# Patient Record
Sex: Female | Born: 1995 | Race: Black or African American | Hispanic: No | Marital: Single | Smoking: Never smoker
Health system: Southern US, Community
[De-identification: ages and names within clinical notes are randomized; demographics above are authoritative.]

## PROBLEM LIST (undated history)

## (undated) ENCOUNTER — Inpatient Hospital Stay (HOSPITAL_COMMUNITY): Payer: Self-pay

## (undated) DIAGNOSIS — B009 Herpesviral infection, unspecified: Secondary | ICD-10-CM

## (undated) DIAGNOSIS — Z3493 Encounter for supervision of normal pregnancy, unspecified, third trimester: Secondary | ICD-10-CM

## (undated) DIAGNOSIS — Z789 Other specified health status: Secondary | ICD-10-CM

## (undated) HISTORY — PX: TONSILLECTOMY: SUR1361

---
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
## 2016-09-29 ENCOUNTER — Ambulatory Visit (HOSPITAL_COMMUNITY)
Admission: EM | Admit: 2016-09-29 | Discharge: 2016-09-29 | Disposition: A | Discharge status: Home / Self Care | Payer: BLUE CROSS/BLUE SHIELD | Attending: Family Medicine | Admitting: Family Medicine

## 2016-09-29 ENCOUNTER — Encounter (HOSPITAL_COMMUNITY): Payer: Self-pay | Admitting: Emergency Medicine

## 2016-09-29 DIAGNOSIS — R21 Rash and other nonspecific skin eruption: Secondary | ICD-10-CM

## 2016-09-29 DIAGNOSIS — T7840XA Allergy, unspecified, initial encounter: Secondary | ICD-10-CM

## 2016-09-29 DIAGNOSIS — L509 Urticaria, unspecified: Secondary | ICD-10-CM

## 2016-09-29 MED ORDER — PREDNISONE 20 MG PO TABS: 20.0000 mg | ORAL_TABLET | Freq: Every day | ORAL | 0 refills | Status: AC

## 2016-09-29 NOTE — ED Notes (Signed)
Patient denies pain and is resting comfortably.  

## 2016-09-29 NOTE — Discharge Instructions (Signed)
You got allergic to something, most likely from the new lotion that you put on yesterday. Take the prednisone for the rash. May continue the benadryl as well for itchiness. Hope you feel better.

## 2016-09-29 NOTE — ED Provider Notes (Signed)
CSN: [id number]     Arrival date & time 09/29/16  1915 History   First MD Initiated Contact with Patient 09/29/16 2001     Chief Complaint  Patient presents with  . Rash   (Consider location/radiation/quality/duration/timing/severity/associated sxs/prior Treatment) [name] is a well-appearing 20 y.o female, with no medical history, presents today for rash all over her body (face, breast, arm, leg and back). She woke up this morning from sleep at 4 am due to skin itchiness and noticed the rash. Rash is very itchy. She have been taking benadryl at home to help with itchiness. She did use a new lotion yesterday morning; lotion was from [employer] and was high fragrance content. She otherwise denies new med, new food, plant exposure, or new laundry detergent. She denies SOB, swollen lips or coughing.    The history is provided by the patient.    History reviewed. No pertinent past medical history. Past Surgical History:  Procedure Laterality Date  . TONSILLECTOMY     No family history on file. Social History  Substance Use Topics  . Smoking status: Never Smoker  . Smokeless tobacco: Never Used  . Alcohol use No   OB History    No data available     Review of Systems  All other systems reviewed and are negative.   Allergies  Review of patient's allergies indicates no known allergies.  Home Medications   Prior to Admission medications   Medication Sig Start Date End Date Taking? Authorizing Provider  diphenhydrAMINE (BENADRYL) 50 MG tablet Take 50 mg by mouth at bedtime as needed for itching.   Yes Historical Provider, MD  predniSONE (DELTASONE) 20 MG tablet Take 1 tablet (20 mg total) by mouth daily with breakfast. Take 3 tablets on day 1-3, two tablets on day 4-6, and 1 tablet on day 7-9 09/29/16 10/08/16  Lucia EstelleFeng Dhrithi Riche, NP   Meds Ordered and Administered this Visit  Medications - No data to display  BP 131/79 (BP Location: Left Arm)   Pulse 78   Temp 99.6 F (37.6  C) (Oral)   Resp 20   LMP 09/09/2016   SpO2 100%  No data found.   Physical Exam  Constitutional: She appears well-developed and well-nourished.  Cardiovascular: Normal rate, regular rhythm and normal heart sounds.   Pulmonary/Chest: Effort normal and breath sounds normal. No respiratory distress. She has no wheezes.  Skin:  Has urticaria rash scatter throughout body on face, back, arms, and legs  Nursing note and vitals reviewed.   Urgent Care Course   Clinical Course    Procedures (including critical care time)  Labs Review Labs Reviewed - No data to display  Imaging Review No results found.   MDM   1. Allergic reaction, initial encounter   2. Urticaria    1) Start taking prednisone 2) Continue with benadryl for itchiness 3) stop using the lotion 4) Return if you do not improve.     Lucia EstelleFeng Kacee Sukhu, NP 09/29/16 2014

## 2016-09-29 NOTE — ED Triage Notes (Signed)
Patient has a splotchy rash to face, arms, breasts, thighs.  Itchy rash, onset yesterday evening.  Patient used a new lotion yesterday morning.  Patient not sure if this caused the rash or not.

## 2016-11-14 ENCOUNTER — Emergency Department (HOSPITAL_COMMUNITY)
Admission: EM | Admit: 2016-11-14 | Discharge: 2016-11-14 | Disposition: A | Discharge status: Home / Self Care | Payer: BLUE CROSS/BLUE SHIELD | Attending: Emergency Medicine | Admitting: Emergency Medicine

## 2016-11-14 ENCOUNTER — Encounter (HOSPITAL_COMMUNITY): Payer: Self-pay | Admitting: Emergency Medicine

## 2016-11-14 DIAGNOSIS — H9202 Otalgia, left ear: Secondary | ICD-10-CM | POA: Diagnosis present

## 2016-11-14 DIAGNOSIS — H66005 Acute suppurative otitis media without spontaneous rupture of ear drum, recurrent, left ear: Secondary | ICD-10-CM | POA: Diagnosis not present

## 2016-11-14 MED ORDER — AMOXICILLIN 500 MG PO CAPS: 500.0000 mg | ORAL_CAPSULE | Freq: Two times a day (BID) | ORAL | 0 refills | Status: DC

## 2016-11-14 NOTE — ED Triage Notes (Signed)
Pt reports sore throat x 1 week. R/ear pain since this am. Treated with Tylenol at 1100 with some relief. Denies shortness of breath. No cough

## 2016-11-14 NOTE — Discharge Instructions (Signed)
Please read and follow all provided instructions.  Your diagnoses today include:  1. Recurrent acute suppurative otitis media without spontaneous rupture of left tympanic membrane    Tests performed today include:  Vital signs. See below for your results today.   Medications prescribed:   Amoxicillin - antibiotic  You have been prescribed an antibiotic medicine: take the entire course of medicine even if you are feeling better. Stopping early can cause the antibiotic not to work.  Take any prescribed medications only as directed.  Home care instructions:  Follow any educational materials contained in this packet.  BE VERY CAREFUL not to take multiple medicines containing Tylenol (also called acetaminophen). Doing so can lead to an overdose which can damage your liver and cause liver failure and possibly death.   Follow-up instructions: Please follow-up with your primary care provider in the next 3 days for further evaluation of your symptoms if not improved.   Return instructions:   Please return to the Emergency Department if you experience worsening symptoms.   Please return if you have any other emergent concerns.  Additional Information:  Your vital signs today were: BP 131/84 (BP Location: Left Arm)    Pulse 75    Temp 98.5 F (36.9 C) (Oral)    Resp 18    Wt 117.9 kg    LMP 10/30/2016 (Exact Date)    SpO2 97%  If your blood pressure (BP) was elevated above 135/85 this visit, please have this repeated by your doctor within one month. --------------

## 2016-11-14 NOTE — ED Provider Notes (Signed)
[facility]-EMERGENCY DEPT Provider Note   CSN: [id number] Arrival date & time: 11/14/16  1114   By signing my name below, I, Nelwyn SalisburyJoshua Fowler, attest that this documentation has been prepared under the direction and in the presence of non-physician practitioner, Rhea BleacherJosh Tashanti Dalporto PA-C. Electronically Signed: Nelwyn SalisburyJoshua Fowler, Scribe. 11/14/2016. 11:48 AM.   History   Chief Complaint Chief Complaint  Patient presents with  . Sore Throat  . Otalgia   The history is provided by the patient. No language interpreter was used.    HPI Comments:  Colleen Zamora is an otherwise healthy 20 y.o. female who presents to the Emergency Department complaining of sudden-onset constant unchanged left ear pain beginning this morning. Pt states that she has tried Tylenol about an hour ago with minor relief. She reports associated cough and resolved sore throat beginning 7 days ago. Pt denies any rhinorrhea, congestion, fever, vomiting, diarrhea, or headache.  History reviewed. No pertinent past medical history.  There are no active problems to display for this patient.   Past Surgical History:  Procedure Laterality Date  . TONSILLECTOMY      OB History    No data available       Home Medications    Prior to Admission medications   Medication Sig Start Date End Date Taking? Authorizing Provider  diphenhydrAMINE (BENADRYL) 50 MG tablet Take 50 mg by mouth at bedtime as needed for itching.    Historical Provider, MD    Family History Family History  Problem Relation Age of Onset  . Diabetes Mother     Social History Social History  Substance Use Topics  . Smoking status: Never Smoker  . Smokeless tobacco: Never Used  . Alcohol use Yes     Allergies   Ceclor [cefaclor]   Review of Systems Review of Systems  Constitutional: Negative for chills, fatigue and fever.  HENT: Positive for ear pain. Negative for congestion, rhinorrhea, sinus pressure and sore throat (Resolved).   Eyes: Negative for  redness.  Respiratory: Positive for cough. Negative for wheezing.   Gastrointestinal: Negative for abdominal pain, diarrhea, nausea and vomiting.  Genitourinary: Negative for dysuria.  Musculoskeletal: Negative for myalgias and neck stiffness.  Skin: Negative for rash.  Neurological: Negative for headaches.  Hematological: Negative for adenopathy.     Physical Exam Updated Vital Signs BP 131/84 (BP Location: Left Arm)   Pulse 75   Temp 98.5 F (36.9 C) (Oral)   Resp 18   Wt 260 lb (117.9 kg)   LMP 10/30/2016 (Exact Date)   SpO2 97%   Physical Exam  Constitutional: She appears well-developed and well-nourished. No distress.  HENT:  Head: Normocephalic and atraumatic.  Right Ear: External ear and ear canal normal. Tympanic membrane is not erythematous.  Left Ear: External ear and ear canal normal. Tympanic membrane is erythematous.  Eyes: Conjunctivae are normal.  Cardiovascular: Normal rate.   No murmur heard. Pulmonary/Chest: Effort normal. No respiratory distress. She has no wheezes. She has no rales.  Abdominal: She exhibits no distension.  Lymphadenopathy:    She has no cervical adenopathy.  Neurological: She is alert. Cranial nerve deficit:   Skin: Skin is warm and dry.  Psychiatric: She has a normal mood and affect.  Nursing note and vitals reviewed.    ED Treatments / Results  DIAGNOSTIC STUDIES:  Oxygen Saturation is 97% on Ra, normal by my interpretation.    COORDINATION OF CARE:  11:57 AM Discussed treatment plan with pt at bedside which includes abx and  pt agreed to plan.  Procedures Procedures (including critical care time)   Initial Impression / Assessment and Plan / ED Course  I have reviewed the triage vital signs and the nursing notes.  Pertinent labs & imaging results that were available during my care of the patient were reviewed by me and considered in my medical decision making (see chart for details).  Clinical Course    Patient seen  and examined. Will treat for otitis media. NSAIDs at home for pain.   Vital signs reviewed and are as follows: BP 131/84 (BP Location: Left Arm)   Pulse 74   Temp 98.5 F (36.9 C) (Oral)   Resp 18   Wt 117.9 kg   LMP 10/30/2016 (Exact Date)   SpO2 99%   Patient counseled on supportive care for infection and s/s to return including worsening symptoms, persistent fever, persistent vomiting, or if they have any other concerns. Urged to see PCP if symptoms persist for more than 3 days. Patient verbalizes understanding and agrees with plan.     Final Clinical Impressions(s) / ED Diagnoses   Final diagnoses:  Recurrent acute suppurative otitis media without spontaneous rupture of left tympanic membrane   Pt with left otitis media, uncomplicated.   New Prescriptions New Prescriptions   AMOXICILLIN (AMOXIL) 500 MG CAPSULE    Take 1 capsule (500 mg total) by mouth 2 (two) times daily.  I personally performed the services described in this documentation, which was scribed in my presence. The recorded information has been reviewed and is accurate.      Renne CriglerJoshua Caiden Monsivais, PA-C 11/14/16 1214    Melene Planan Floyd, DO 11/14/16 2026

## 2017-04-12 ENCOUNTER — Emergency Department (HOSPITAL_COMMUNITY)
Admission: EM | Admit: 2017-04-12 | Discharge: 2017-04-12 | Disposition: A | Discharge status: Home / Self Care | Payer: BLUE CROSS/BLUE SHIELD | Attending: Emergency Medicine | Admitting: Emergency Medicine

## 2017-04-12 ENCOUNTER — Encounter (HOSPITAL_COMMUNITY): Payer: Self-pay | Admitting: Emergency Medicine

## 2017-04-12 DIAGNOSIS — N939 Abnormal uterine and vaginal bleeding, unspecified: Secondary | ICD-10-CM | POA: Insufficient documentation

## 2017-04-12 DIAGNOSIS — Z79899 Other long term (current) drug therapy: Secondary | ICD-10-CM | POA: Insufficient documentation

## 2017-04-12 LAB — BASIC METABOLIC PANEL
ANION GAP: 6 (ref 5–15)
BUN: 14 mg/dL (ref 6–20)
CALCIUM: 9.1 mg/dL (ref 8.9–10.3)
CO2: 27 mmol/L (ref 22–32)
CREATININE: 0.77 mg/dL (ref 0.44–1.00)
Chloride: 106 mmol/L (ref 101–111)
Glucose, Bld: 87 mg/dL (ref 65–99)
Potassium: 3.8 mmol/L (ref 3.5–5.1)
Sodium: 139 mmol/L (ref 135–145)

## 2017-04-12 LAB — CBC WITH DIFFERENTIAL/PLATELET
BASOS ABS: 0 10*3/uL (ref 0.0–0.1)
BASOS PCT: 0 %
EOS ABS: 0.1 10*3/uL (ref 0.0–0.7)
EOS PCT: 2 %
HCT: 39.1 % (ref 36.0–46.0)
Hemoglobin: 13 g/dL (ref 12.0–15.0)
LYMPHS ABS: 2.6 10*3/uL (ref 0.7–4.0)
LYMPHS PCT: 37 %
MCH: 29.3 pg (ref 26.0–34.0)
MCHC: 33.2 g/dL (ref 30.0–36.0)
MCV: 88.1 fL (ref 78.0–100.0)
MONOS PCT: 7 %
Monocytes Absolute: 0.5 10*3/uL (ref 0.1–1.0)
NEUTROS PCT: 54 %
Neutro Abs: 3.8 10*3/uL (ref 1.7–7.7)
PLATELETS: 343 10*3/uL (ref 150–400)
RBC: 4.44 MIL/uL (ref 3.87–5.11)
RDW: 12.6 % (ref 11.5–15.5)
WBC: 7.1 10*3/uL (ref 4.0–10.5)

## 2017-04-12 LAB — WET PREP, GENITAL
Clue Cells Wet Prep HPF POC: NONE SEEN
SPERM: NONE SEEN
Trich, Wet Prep: NONE SEEN
YEAST WET PREP: NONE SEEN

## 2017-04-12 LAB — I-STAT BETA HCG BLOOD, ED (MC, WL, AP ONLY): I-stat hCG, quantitative: <5 m[IU]/mL (ref <5)

## 2017-04-12 NOTE — ED Notes (Signed)
Pt reports to having new vaginal bleeding that is not consistent with her typical period. Pt reports going through 1 pad an hour on 04/10/17

## 2017-04-12 NOTE — ED Triage Notes (Signed)
Pt states she had a normal period on the 14th of April and this past Sunday started having vaginal bleeding  Pt states she is having to change her pad every 1-2 hours  Pt states she has passed some small clots but nothing out of the ordinary  Denies pain but states her stomach has been feeling funny for a couple of weeks

## 2017-04-12 NOTE — ED Provider Notes (Signed)
[facility]-EMERGENCY DEPT Provider Note   CSN: [id number] Arrival date & time: 04/12/17  0155     History   Chief Complaint Chief Complaint  Patient presents with  . Vaginal Bleeding    HPI Colleen Zamora is a 21 y.o. female with no major medical hx presents to the Emergency Department complaining of gradual, persistent, Vaginal bleeding onset 2 days ago. Patient reports that she had a normal menstrual cycle on 03/24/2017. She reports that she took a plan B tablet on 03/31/2017 and began bleeding on April 28th 2018.  She denies all associated symptoms. She denies fevers or chills, nausea or vomiting, abdominal pain. She reports she 6 active with 1 female partner. She reports that on the 19th she had a full STD panel which was negative. She denies concern about STD. Nothing makes her symptoms better or worse. She reports having small clots but no large clots. She denies syncope, near syncope, lightheadedness or dizziness.   The history is provided by the patient and medical records. No language interpreter was used.    History reviewed. No pertinent past medical history.  There are no active problems to display for this patient.   Past Surgical History:  Procedure Laterality Date  . TONSILLECTOMY      OB History    No data available       Home Medications    Prior to Admission medications   Medication Sig Start Date End Date Taking? Authorizing Provider  amoxicillin (AMOXIL) 500 MG capsule Take 1 capsule (500 mg total) by mouth 2 (two) times daily. 11/14/16   Renne Crigler, PA-C  diphenhydrAMINE (BENADRYL) 50 MG tablet Take 50 mg by mouth at bedtime as needed for itching.    Historical Provider, MD    Family History Family History  Problem Relation Age of Onset  . Diabetes Mother     Social History Social History  Substance Use Topics  . Smoking status: Never Smoker  . Smokeless tobacco: Never Used  . Alcohol use Yes     Comment: social     Allergies   Ceclor  [cefaclor]   Review of Systems Review of Systems  Constitutional: Negative for chills and fever.  Eyes: Negative for visual disturbance.  Respiratory: Negative for chest tightness.   Cardiovascular: Negative for chest pain.  Gastrointestinal: Negative for abdominal pain, blood in stool, diarrhea, nausea and vomiting.  Endocrine: Negative for polydipsia, polyphagia and polyuria.  Genitourinary: Positive for vaginal bleeding. Negative for dysuria, flank pain, vaginal discharge and vaginal pain.  Musculoskeletal: Negative for back pain.  Skin: Negative for wound.  Neurological: Negative for syncope and light-headedness.  Hematological: Does not bruise/bleed easily.  All other systems reviewed and are negative.    Physical Exam Updated Vital Signs BP 131/84 (BP Location: Right Arm)   Pulse 80   Temp 98.7 F (37.1 C) (Oral)   Resp 18   Ht  (1.727 m)   Wt 125.6 kg   LMP 03/24/2017 (Exact Date)   SpO2 99%   BMI 42.12 kg/m   Physical Exam  Constitutional: She appears well-developed and well-nourished. No distress.  HENT:  Head: Normocephalic and atraumatic.  Eyes: Conjunctivae are normal.  Neck: Normal range of motion.  Cardiovascular: Normal rate, regular rhythm, normal heart sounds and intact distal pulses.   No murmur heard. Pulmonary/Chest: Effort normal and breath sounds normal. No respiratory distress. She has no wheezes.  Abdominal: Soft. Bowel sounds are normal. There is no tenderness. There is no rebound and  no guarding. Hernia confirmed negative in the right inguinal area and confirmed negative in the left inguinal area.  Genitourinary: Uterus normal. No labial fusion. There is no rash, tenderness or lesion on the right labia. There is no rash, tenderness or lesion on the left labia. Uterus is not deviated, not enlarged, not fixed and not tender. Cervix exhibits no motion tenderness, no discharge and no friability. Right adnexum displays no mass, no tenderness and  no fullness. Left adnexum displays no mass, no tenderness and no fullness. There is bleeding ( Small amount of blood in the vaginal vault) in the vagina. No erythema or tenderness in the vagina. No foreign body in the vagina. No signs of injury around the vagina. No vaginal discharge found.  Genitourinary Comments: Cervical os is closed  Musculoskeletal: Normal range of motion. She exhibits no edema.  Lymphadenopathy:       Right: No inguinal adenopathy present.       Left: No inguinal adenopathy present.  Neurological: She is alert.  Skin: Skin is warm and dry. She is not diaphoretic. No erythema.  Psychiatric: She has a normal mood and affect.  Nursing note and vitals reviewed.    ED Treatments / Results  Labs (all labs ordered are listed, but only abnormal results are displayed) Labs Reviewed  WET PREP, GENITAL - Abnormal; Notable for the following:       Result Value   WBC, Wet Prep HPF POC FEW (*)    All other components within normal limits  CBC WITH DIFFERENTIAL/PLATELET  BASIC METABOLIC PANEL  I-STAT BETA HCG BLOOD, ED ([facility], [facility], [facility] ONLY)    Procedures Procedures (including critical care time)  Medications Ordered in ED Medications - No data to display   Initial Impression / Assessment and Plan / ED Course  I have reviewed the triage vital signs and the nursing notes.  Pertinent labs & imaging results that were available during my care of the patient were reviewed by me and considered in my medical decision making (see chart for details).  Clinical Course as of Apr 12 436  Tue Apr 12, 2017  0435 BHcg is < 5 - not crossing over  [HM]    Clinical Course User Index [HM] Dierdre Forth, PA-C    Patient vaginal bleeding. Negative pregnancy test. No anemia. No evidence of vaginal infection. Patient with recent negative STD testing. No large clots in the vaginal vault. Patient will be referred to OB/GYN for further evaluation. Discussed reasons to return to the  emergency department. Patient states understanding and is in agreement with the plan. Normal vital signs.  Final Clinical Impressions(s) / ED Diagnoses   Final diagnoses:  Vaginal bleeding    New Prescriptions New Prescriptions   No medications on file     Dierdre Forth, PA-C 04/12/17 1610    Geoffery Lyons, MD 04/12/17 [phone]

## 2017-04-12 NOTE — ED Notes (Signed)
Pt reports understanding of discharge information. No questions at time of discharge 

## 2017-04-12 NOTE — Discharge Instructions (Signed)
1. Medications: usual home medications 2. Treatment: rest, drink plenty of fluids,  3. Follow Up: Please followup with your OB/GYN for discussion of your diagnoses and further evaluation after today's visit; if you do not have a primary care doctor use the resource guide provided to find one; Please return to the ER for worsening bleeding, development of pain, passing out, SOB or other concerns

## 2018-03-30 ENCOUNTER — Inpatient Hospital Stay (HOSPITAL_COMMUNITY): Payer: BLUE CROSS/BLUE SHIELD

## 2018-03-30 ENCOUNTER — Inpatient Hospital Stay (HOSPITAL_COMMUNITY)
Admission: AD | Admit: 2018-03-30 | Discharge: 2018-03-30 | Disposition: A | Discharge status: Home / Self Care | Payer: BLUE CROSS/BLUE SHIELD | Source: Ambulatory Visit | Attending: Family Medicine | Admitting: Family Medicine

## 2018-03-30 ENCOUNTER — Encounter (HOSPITAL_COMMUNITY): Payer: Self-pay | Admitting: *Deleted

## 2018-03-30 DIAGNOSIS — O26891 Other specified pregnancy related conditions, first trimester: Secondary | ICD-10-CM

## 2018-03-30 DIAGNOSIS — R110 Nausea: Secondary | ICD-10-CM | POA: Diagnosis present

## 2018-03-30 DIAGNOSIS — N8311 Corpus luteum cyst of right ovary: Secondary | ICD-10-CM | POA: Insufficient documentation

## 2018-03-30 DIAGNOSIS — Z3A01 Less than 8 weeks gestation of pregnancy: Secondary | ICD-10-CM | POA: Diagnosis not present

## 2018-03-30 DIAGNOSIS — O3680X0 Pregnancy with inconclusive fetal viability, not applicable or unspecified: Secondary | ICD-10-CM | POA: Diagnosis not present

## 2018-03-30 DIAGNOSIS — R1030 Lower abdominal pain, unspecified: Secondary | ICD-10-CM | POA: Diagnosis present

## 2018-03-30 DIAGNOSIS — R109 Unspecified abdominal pain: Secondary | ICD-10-CM

## 2018-03-30 DIAGNOSIS — O3481 Maternal care for other abnormalities of pelvic organs, first trimester: Secondary | ICD-10-CM | POA: Diagnosis not present

## 2018-03-30 HISTORY — DX: Other specified health status: Z78.9

## 2018-03-30 LAB — WET PREP, GENITAL
SPERM: NONE SEEN
Trich, Wet Prep: NONE SEEN
YEAST WET PREP: NONE SEEN

## 2018-03-30 LAB — URINALYSIS, ROUTINE W REFLEX MICROSCOPIC
Bilirubin Urine: NEGATIVE
GLUCOSE, UA: NEGATIVE mg/dL
HGB URINE DIPSTICK: NEGATIVE
Ketones, ur: NEGATIVE mg/dL
Leukocytes, UA: NEGATIVE
Nitrite: NEGATIVE
PH: 6 (ref 5.0–8.0)
Protein, ur: NEGATIVE mg/dL
SPECIFIC GRAVITY, URINE: 1.023 (ref 1.005–1.030)

## 2018-03-30 LAB — ABO/RH: ABO/RH(D): AB POS

## 2018-03-30 LAB — CBC
HEMATOCRIT: 36.5 % (ref 36.0–46.0)
HEMOGLOBIN: 12.2 g/dL (ref 12.0–15.0)
MCH: 28.7 pg (ref 26.0–34.0)
MCHC: 33.4 g/dL (ref 30.0–36.0)
MCV: 85.9 fL (ref 78.0–100.0)
Platelets: 367 10*3/uL (ref 150–400)
RBC: 4.25 MIL/uL (ref 3.87–5.11)
RDW: 12.9 % (ref 11.5–15.5)
WBC: 9.3 10*3/uL (ref 4.0–10.5)

## 2018-03-30 LAB — POCT PREGNANCY, URINE: Preg Test, Ur: POSITIVE — AB

## 2018-03-30 LAB — HCG, QUANTITATIVE, PREGNANCY: hCG, Beta Chain, Quant, S: 500 m[IU]/mL — ABNORMAL HIGH (ref <5)

## 2018-03-30 NOTE — MAU Provider Note (Addendum)
Chief Complaint: Abdominal Pain   First Provider Initiated Contact with Patient 03/30/18 1813    Visit started by Sharen CounterLisa Leftwich-Kirby and finished by me    SUBJECTIVE HPI: Colleen Zamora is a 22 y.o. G3P0020 at [redacted]w[redacted]d by LMP who presents to maternity admissions reporting lower abdominal pain for several days.  Has some nausea, not severe. . She denies vaginal bleeding, vaginal itching/burning, urinary symptoms, h/a, dizziness, n/v, or fever/chills.    Abdominal Pain  This is a new problem. The current episode started in the past 7 days. The onset quality is gradual. The problem occurs constantly. The problem has been unchanged. The pain is located in the suprapubic region, LLQ and RLQ. The quality of the pain is cramping. The abdominal pain does not radiate. Pertinent negatives include no constipation, diarrhea, dysuria or fever. Nothing aggravates the pain. The pain is relieved by nothing. She has tried nothing for the symptoms.   RN Note: Pt reports sh has had lower abd pain for 2-3 days.  Reports some nausea but no vomiting    Past Medical History:  Diagnosis Date  . Medical history non-contributory    Past Surgical History:  Procedure Laterality Date  . TONSILLECTOMY     Social History   Socioeconomic History  . Marital status: Single    Spouse name: Not on file  . Number of children: Not on file  . Years of education: Not on file  . Highest education level: Not on file  Occupational History  . Not on file  Social Needs  . Financial resource strain: Not on file  . Food insecurity:    Worry: Not on file    Inability: Not on file  . Transportation needs:    Medical: Not on file    Non-medical: Not on file  Tobacco Use  . Smoking status: Never Smoker  . Smokeless tobacco: Never Used  Substance and Sexual Activity  . Alcohol use: Yes    Comment: social  . Drug use: No  . Sexual activity: Yes    Birth control/protection: None  Lifestyle  . Physical activity:    Days  per week: Not on file    Minutes per session: Not on file  . Stress: Not on file  Relationships  . Social connections:    Talks on phone: Not on file    Gets together: Not on file    Attends religious service: Not on file    Active member of club or organization: Not on file    Attends meetings of clubs or organizations: Not on file    Relationship status: Not on file  . Intimate partner violence:    Fear of current or ex partner: Not on file    Emotionally abused: Not on file    Physically abused: Not on file    Forced sexual activity: Not on file  Other Topics Concern  . Not on file  Social History Narrative  . Not on file   No current facility-administered medications on file prior to encounter.    No current outpatient medications on file prior to encounter.   Allergies  Allergen Reactions  . Ceclor [Cefaclor] Hives    I have reviewed patient's Past Medical Hx, Surgical Hx, Family Hx, Social Hx, medications and allergies.   ROS:  Review of Systems  Constitutional: Negative for fever.  Gastrointestinal: Positive for abdominal pain. Negative for constipation and diarrhea.  Genitourinary: Negative for dysuria.   Review of Systems  Other systems negative  Physical Exam  Physical Exam Patient Vitals for the past 24 hrs:  BP Temp Temp src Pulse Resp Height Weight  03/30/18 2015 129/75 - - - - - -  03/30/18 1543 127/75 98.8 F (37.1 C) Oral 87 18 5\' 8"  (1.727 m) 290 lb (131.5 kg)   Constitutional: Well-developed, well-nourished female in no acute distress.  Cardiovascular: normal rate Respiratory: normal effort GI: Abd soft, non-tender. Pos BS x 4 MS: Extremities nontender, no edema, normal ROM Neurologic: Alert and oriented x 4.  GU: Neg CVAT.  PELVIC EXAM: Cervix pink, visually closed, without lesion, scant white creamy discharge, vaginal walls and external genitalia normal Bimanual exam: Cervix 0/long/high, firm, anterior, neg CMT, uterus nontender,  nonenlarged, adnexa without tenderness, enlargement, or mass   LAB RESULTS Results for orders placed or performed during the hospital encounter of 03/30/18 (from the past 24 hour(s))  Urinalysis, Routine w reflex microscopic     Status: None   Collection Time: 03/30/18  3:45 PM  Result Value Ref Range   Color, Urine YELLOW YELLOW   APPearance CLEAR CLEAR   Specific Gravity, Urine 1.023 1.005 - 1.030   pH 6.0 5.0 - 8.0   Glucose, UA NEGATIVE NEGATIVE mg/dL   Hgb urine dipstick NEGATIVE NEGATIVE   Bilirubin Urine NEGATIVE NEGATIVE   Ketones, ur NEGATIVE NEGATIVE mg/dL   Protein, ur NEGATIVE NEGATIVE mg/dL   Nitrite NEGATIVE NEGATIVE   Leukocytes, UA NEGATIVE NEGATIVE  Pregnancy, urine POC     Status: Abnormal   Collection Time: 03/30/18  4:31 PM  Result Value Ref Range   Preg Test, Ur POSITIVE (A) NEGATIVE  CBC     Status: None   Collection Time: 03/30/18  6:52 PM  Result Value Ref Range   WBC 9.3 4.0 - 10.5 K/uL   RBC 4.25 3.87 - 5.11 MIL/uL   Hemoglobin 12.2 12.0 - 15.0 g/dL   HCT 16.1 09.6 - 04.5 %   MCV 85.9 78.0 - 100.0 fL   MCH 28.7 26.0 - 34.0 pg   MCHC 33.4 30.0 - 36.0 g/dL   RDW 40.9 81.1 - 91.4 %   Platelets 367 150 - 400 K/uL  hCG, quantitative, pregnancy     Status: Abnormal   Collection Time: 03/30/18  6:52 PM  Result Value Ref Range   hCG, Beta Chain, Quant, S 500 (H) <5 mIU/mL  ABO/Rh     Status: None (Preliminary result)   Collection Time: 03/30/18  6:52 PM  Result Value Ref Range   ABO/RH(D)      AB POS Performed at [facility], [address], [place], [place] [address]   Wet prep, genital     Status: Abnormal   Collection Time: 03/30/18  7:10 PM  Result Value Ref Range   Yeast Wet Prep HPF POC NONE SEEN NONE SEEN   Trich, Wet Prep NONE SEEN NONE SEEN   Clue Cells Wet Prep HPF POC PRESENT (A) NONE SEEN   WBC, Wet Prep HPF POC MODERATE (A) NONE SEEN   Sperm NONE SEEN     --/--/AB POS Performed at [facility], [address],  [place], [place] [address]  [phone] 1852)  IMAGING US Ob Comp Less 14 Wks  Result Date: 03/30/2018 CLINICAL DATA:  Abdominal pain. Estimated gestational age by last menstrual period equals 4 weeks 5 days EXAM: OBSTETRIC <14 WK [place] AND TRANSVAGINAL OB US TECHNIQUE: Both transabdominal and transvaginal ultrasound examinations were performed for complete evaluation of the gestation as well as the maternal  uterus, adnexal regions, and pelvic cul-de-sac. Transvaginal technique was performed to assess early pregnancy. COMPARISON:  None. FINDINGS: Intrauterine gestational sac: Not identified Yolk sac:  Not identified Embryo:  Not identified Subchorionic hemorrhage:  None Maternal uterus/adnexae: Corpus luteal cyst in the RIGHT ovary. Normal LEFT ovary. Trace free fluid IMPRESSION: No intrauterine gestational sac, yolk sac, or fetal pole identified. Differential considerations include intrauterine pregnancy too early to be sonographically visualized, missed abortion, or ectopic pregnancy. Followup ultrasound is recommended in 10-14 days for further evaluation. Electronically Signed   By: Genevive Bi M.D.   On: 03/30/2018 19:53   US Ob Transvaginal  Result Date: 03/30/2018 CLINICAL DATA:  Abdominal pain. Estimated gestational age by last menstrual period equals 4 weeks 5 days EXAM: OBSTETRIC <14 WK [place] AND TRANSVAGINAL OB US TECHNIQUE: Both transabdominal and transvaginal ultrasound examinations were performed for complete evaluation of the gestation as well as the maternal uterus, adnexal regions, and pelvic cul-de-sac. Transvaginal technique was performed to assess early pregnancy. COMPARISON:  None. FINDINGS: Intrauterine gestational sac: Not identified Yolk sac:  Not identified Embryo:  Not identified Subchorionic hemorrhage:  None Maternal uterus/adnexae: Corpus luteal cyst in the RIGHT ovary. Normal LEFT ovary. Trace free fluid IMPRESSION: No intrauterine gestational sac, yolk sac, or fetal pole identified.  Differential considerations include intrauterine pregnancy too early to be sonographically visualized, missed abortion, or ectopic pregnancy. Followup ultrasound is recommended in 10-14 days for further evaluation. Electronically Signed   By: Genevive Bi M.D.   On: 03/30/2018 19:53    MAU Management/MDM: Ordered usual first trimester r/o ectopic labs.   Pelvic exam and cultures done Will check baseline Ultrasound to rule out ectopic.  This bleeding/pain can represent a normal pregnancy with bleeding, spontaneous abortion or even an ectopic which can be life-threatening.  The process as listed above helps to determine which of these is present.  Discussed findings with patient. Cannot exclude ectopic pregnancy yet.  Discussed warning signs to return for.  Recommend repeat HCG in 48 hrs. Has to work until 9pm sat but I told her to come after.   ASSESSMENT 1. Pregnancy of unknown anatomic location   2. Abdominal pain during pregnancy in first trimester     PLAN Discharge home Plan to repeat HCG level in 48 hours in MAU (weekend) Will repeat  Ultrasound in about 7-10 days if HCG levels double appropriately  Ectopic precautions   Pt stable at time of discharge. Encouraged to return here or to other Urgent Care/ED if she develops worsening of symptoms, increase in pain, fever, or other concerning symptoms.    Wynelle Bourgeois CNM, MSN Certified Nurse-Midwife 03/30/2018  8:17 PM

## 2018-03-30 NOTE — Discharge Instructions (Signed)

## 2018-03-30 NOTE — MAU Note (Signed)
Pt reports sh has had lower abd pain for 2-3 days.  Reports some nausea but no vomiting.

## 2018-03-31 LAB — HIV ANTIBODY (ROUTINE TESTING W REFLEX): HIV SCREEN 4TH GENERATION: NONREACTIVE

## 2018-03-31 LAB — GC/CHLAMYDIA PROBE AMP (~~LOC~~) NOT AT ARMC
Chlamydia: NEGATIVE
NEISSERIA GONORRHEA: NEGATIVE

## 2018-04-01 ENCOUNTER — Inpatient Hospital Stay (HOSPITAL_COMMUNITY)
Admission: AD | Admit: 2018-04-01 | Discharge: 2018-04-01 | Disposition: A | Discharge status: Home / Self Care | Payer: BLUE CROSS/BLUE SHIELD | Source: Ambulatory Visit | Attending: Family Medicine | Admitting: Family Medicine

## 2018-04-01 DIAGNOSIS — O0281 Inappropriate change in quantitative human chorionic gonadotropin (hCG) in early pregnancy: Secondary | ICD-10-CM | POA: Diagnosis present

## 2018-04-01 DIAGNOSIS — O283 Abnormal ultrasonic finding on antenatal screening of mother: Secondary | ICD-10-CM | POA: Diagnosis not present

## 2018-04-01 DIAGNOSIS — O3680X0 Pregnancy with inconclusive fetal viability, not applicable or unspecified: Secondary | ICD-10-CM

## 2018-04-01 LAB — HCG, QUANTITATIVE, PREGNANCY: hCG, Beta Chain, Quant, S: 933 m[IU]/mL — ABNORMAL HIGH (ref <5)

## 2018-04-01 NOTE — Discharge Instructions (Signed)
First Trimester of Pregnancy The first trimester of pregnancy is from week 1 until the end of week 13 (months 1 through 3). During this time, your baby will begin to develop inside you. At 6-8 weeks, the eyes and face are formed, and the heartbeat can be seen on ultrasound. At the end of 12 weeks, all the baby's organs are formed. Prenatal care is all the medical care you receive before the birth of your baby. Make sure you get good prenatal care and follow all of your doctor's instructions. Follow these instructions at home: Medicines  Take over-the-counter and prescription medicines only as told by your doctor. Some medicines are safe and some medicines are not safe during pregnancy.  Take a prenatal vitamin that contains at least 600 micrograms (mcg) of folic acid.  If you have trouble pooping (constipation), take medicine that will make your stool soft (stool softener) if your doctor approves. Eating and drinking  Eat regular, healthy meals.  Your doctor will tell you the amount of weight gain that is right for you.  Avoid raw meat and uncooked cheese.  If you feel sick to your stomach (nauseous) or throw up (vomit): ? Eat 4 or 5 small meals a day instead of 3 large meals. ? Try eating a few soda crackers. ? Drink liquids between meals instead of during meals.  To prevent constipation: ? Eat foods that are high in fiber, like fresh fruits and vegetables, whole grains, and beans. ? Drink enough fluids to keep your pee (urine) clear or pale yellow. Activity  Exercise only as told by your doctor. Stop exercising if you have cramps or pain in your lower belly (abdomen) or low back.  Do not exercise if it is too hot, too humid, or if you are in a place of great height (high altitude).  Try to avoid standing for long periods of time. Move your legs often if you must stand in one place for a long time.  Avoid heavy lifting.  Wear low-heeled shoes. Sit and stand up straight.  You  can have sex unless your doctor tells you not to. Relieving pain and discomfort  Wear a good support bra if your breasts are sore.  Take warm water baths (sitz baths) to soothe pain or discomfort caused by hemorrhoids. Use hemorrhoid cream if your doctor says it is okay.  Rest with your legs raised if you have leg cramps or low back pain.  If you have puffy, bulging veins (varicose veins) in your legs: ? Wear support hose or compression stockings as told by your doctor. ? Raise (elevate) your feet for 15 minutes, 3-4 times a day. ? Limit salt in your food. Prenatal care  Schedule your prenatal visits by the twelfth week of pregnancy.  Write down your questions. Take them to your prenatal visits.  Keep all your prenatal visits as told by your doctor. This is important. Safety  Wear your seat belt at all times when driving.  Make a list of emergency phone numbers. The list should include numbers for family, friends, the hospital, and police and fire departments. General instructions  Ask your doctor for a referral to a local prenatal class. Begin classes no later than at the start of month 6 of your pregnancy.  Ask for help if you need counseling or if you need help with nutrition. Your doctor can give you advice or tell you where to go for help.  Do not use hot tubs, steam rooms, or   saunas.  Do not douche or use tampons or scented sanitary pads.  Do not cross your legs for long periods of time.  Avoid all herbs and alcohol. Avoid drugs that are not approved by your doctor.  Do not use any tobacco products, including cigarettes, chewing tobacco, and electronic cigarettes. If you need help quitting, ask your doctor. You may get counseling or other support to help you quit.  Avoid cat litter boxes and soil used by cats. These carry germs that can cause birth defects in the baby and can cause a loss of your baby (miscarriage) or stillbirth.  Visit your dentist. At home, brush  your teeth with a soft toothbrush. Be gentle when you floss. Contact a doctor if:  You are dizzy.  You have mild cramps or pressure in your lower belly.  You have a nagging pain in your belly area.  You continue to feel sick to your stomach, you throw up, or you have watery poop (diarrhea).  You have a bad smelling fluid coming from your vagina.  You have pain when you pee (urinate).  You have increased puffiness (swelling) in your face, hands, legs, or ankles. Get help right away if:  You have a fever.  You are leaking fluid from your vagina.  You have spotting or bleeding from your vagina.  You have very bad belly cramping or pain.  You gain or lose weight rapidly.  You throw up blood. It may look like coffee grounds.  You are around people who have German measles, fifth disease, or chickenpox.  You have a very bad headache.  You have shortness of breath.  You have any kind of trauma, such as from a fall or a car accident. Summary  The first trimester of pregnancy is from week 1 until the end of week 13 (months 1 through 3).  To take care of yourself and your unborn baby, you will need to eat healthy meals, take medicines only if your doctor tells you to do so, and do activities that are safe for you and your baby.  Keep all follow-up visits as told by your doctor. This is important as your doctor will have to ensure that your baby is healthy and growing well. This information is not intended to replace advice given to you by your health care provider. Make sure you discuss any questions you have with your health care provider. Document Released: 05/17/2008 Document Revised: 12/07/2016 Document Reviewed: 12/07/2016 Elsevier Interactive Patient Education  2017 [employer]  

## 2018-04-01 NOTE — MAU Note (Signed)
Pt here for repeat hcg. No pain or bleeding

## 2018-04-01 NOTE — MAU Provider Note (Signed)
[name]  is a 22 y.o. G3P0020 at 7480w0d who presents to MAU today for follow-up quant hCG after 48 hours. She denies pain or bleeding today  BP 137/78 (BP Location: Right Arm)   Pulse 85   Temp 98.6 F (37 C)   Resp 16   Wt 293 lb (132.9 kg)   LMP 02/25/2018   SpO2 99%   BMI 44.55 kg/m   CONSTITUTIONAL: Well-developed, well-nourished female in no acute distress.  EYES: EOM intact, conjunctivae normal.  MUSCULOSKELETAL: Normal range of motion.  CARDIOVASCULAR: Regular heart rate RESPIRATORY: Normal effort NEUROLOGICAL: Alert and oriented to person, place, and time.  SKIN: Skin is warm and dry. No rash noted. Not diaphoretic. No erythema. No pallor. PSYCH: Normal mood and affect. Normal behavior. Normal judgment and thought content.  Results for Colleen JoEWBY, [place] (MRN [id number]) as of 04/01/2018 15:06  Ref. Range 03/30/2018 18:52 03/30/2018 19:10 03/30/2018 19:40 04/01/2018 13:41  HCG, Beta Chain, Quant, S Latest Ref Range: <5 mIU/mL 500 (H)   933 (H)   A: Appropriate rise in quant hCG after 48 hours  P: Discharge home First trimester/ectopic precautions discussed Patient will return for follow-up US in 1 week. Order placed. They will call the patient with an appointment time Patient may return to MAU as needed or if her condition were to change or worsen   Marny LowensteinWenzel, Julie N, PA-C 04/01/2018 3:06 PM

## 2018-04-08 ENCOUNTER — Inpatient Hospital Stay (HOSPITAL_COMMUNITY)
Admission: AD | Admit: 2018-04-08 | Discharge: 2018-04-08 | Disposition: A | Discharge status: Home / Self Care | Payer: BLUE CROSS/BLUE SHIELD | Source: Ambulatory Visit | Attending: Obstetrics and Gynecology | Admitting: Obstetrics and Gynecology

## 2018-04-08 DIAGNOSIS — O284 Abnormal radiological finding on antenatal screening of mother: Secondary | ICD-10-CM | POA: Insufficient documentation

## 2018-04-08 DIAGNOSIS — O3680X0 Pregnancy with inconclusive fetal viability, not applicable or unspecified: Secondary | ICD-10-CM

## 2018-04-08 DIAGNOSIS — Z3A01 Less than 8 weeks gestation of pregnancy: Secondary | ICD-10-CM | POA: Insufficient documentation

## 2018-04-08 NOTE — Progress Notes (Signed)
Donette Larry, CNM discussed POC with patient.  She was supposed to have a scheduled outpatient [place].  Pt verbalized understanding of plan.  No c/o pain at this time.

## 2018-04-08 NOTE — MAU Provider Note (Signed)
[name] is a 22 y.o. G3P0020 at [redacted]w[redacted]d who presents to MAU for f/u US. She was seen on 04/01/18 and had appropriate rise in f/u quant HCG. She thought she was told to return here in 1 week for [place]. The patient denies abdominal pain or vaginal bleeding today.   BP 128/78   Pulse 93   Temp 98.6 F (37 C) (Oral)   Resp 18   LMP 02/25/2018   SpO2 95%   CONSTITUTIONAL: Well-developed, well-nourished female in no acute distress.  MUSCULOSKELETAL: Normal range of motion.  CARDIOVASCULAR: Regular heart rate RESPIRATORY: Normal effort NEUROLOGICAL: Alert and oriented to person, place, and time.  SKIN: No pallor. PSYCH: Normal mood and affect. Normal behavior. Normal judgment and thought content.  No results found for this or any previous visit (from the past 24 hour(s)).  MDM Reviewed previous provider's note. Clarified with pt that someone from the [place] dept will call her with f/u US appt. No concerning sx today. Pt understands. Message sent to [facility].   A: Inconclusive fetal viability  P: Discharge home Patient may return to MAU as needed or if her condition were to change or worsen  Follow up with [facility] radiology dept this upcoming week for [place]  Donette Larry, [place]  04/08/2018 3:59 PM

## 2018-04-08 NOTE — MAU Note (Signed)
Pt was seen for repeat labs last week and told to f/u with an [place] a week later.  Denies pain or vaginal bleeding or discharge now.

## 2018-04-13 ENCOUNTER — Ambulatory Visit (HOSPITAL_COMMUNITY)
Admission: RE | Admit: 2018-04-13 | Discharge: 2018-04-13 | Disposition: A | Discharge status: Home / Self Care | Payer: BLUE CROSS/BLUE SHIELD | Source: Ambulatory Visit | Attending: Medical | Admitting: Medical

## 2018-04-13 ENCOUNTER — Ambulatory Visit (INDEPENDENT_AMBULATORY_CARE_PROVIDER_SITE_OTHER): Payer: BLUE CROSS/BLUE SHIELD | Admitting: *Deleted

## 2018-04-13 DIAGNOSIS — Z369 Encounter for antenatal screening, unspecified: Secondary | ICD-10-CM | POA: Insufficient documentation

## 2018-04-13 DIAGNOSIS — O3680X0 Pregnancy with inconclusive fetal viability, not applicable or unspecified: Secondary | ICD-10-CM

## 2018-04-13 DIAGNOSIS — Z712 Person consulting for explanation of examination or test findings: Secondary | ICD-10-CM

## 2018-04-13 DIAGNOSIS — Z3A01 Less than 8 weeks gestation of pregnancy: Secondary | ICD-10-CM | POA: Diagnosis not present

## 2018-04-13 DIAGNOSIS — O34531 Maternal care for retroversion of gravid uterus, first trimester: Secondary | ICD-10-CM | POA: Diagnosis not present

## 2018-04-13 NOTE — Progress Notes (Signed)
Patient presents for u/s results. Report reviewed by Dr Macon Large. Ok to start pnc. Patient given u/s pic, advised to start pnv, eat healthy, avoid smoking alcohol and drugs. Start care. Understanding voiced.

## 2018-04-14 NOTE — Progress Notes (Signed)
I have reviewed the chart and agree with nursing staff's documentation of this patient's encounter.  Jaynie Collins, MD 04/14/2018 12:46 PM

## 2018-05-09 IMAGING — US US OB TRANSVAGINAL
1 series · 15 of 28 positions shown · non-contrast
Comparison: None.

CLINICAL DATA: Abdominal pain. Estimated gestational age by last
menstrual period equals 4 weeks 5 days

EXAM:
OBSTETRIC <14 WK US AND TRANSVAGINAL OB US
TECHNIQUE: Both transabdominal and transvaginal ultrasound examinations were
performed for complete evaluation of the gestation as well as the
maternal uterus, adnexal regions, and pelvic cul-de-sac.
Transvaginal technique was performed to assess early pregnancy.

[Series 1: us ob transvaginal · 15 of 33 slices shown]
[im 1/33]
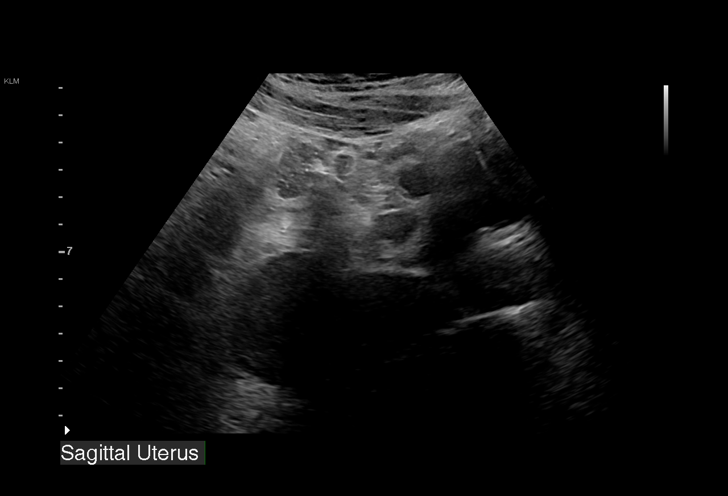
[im 3/33]
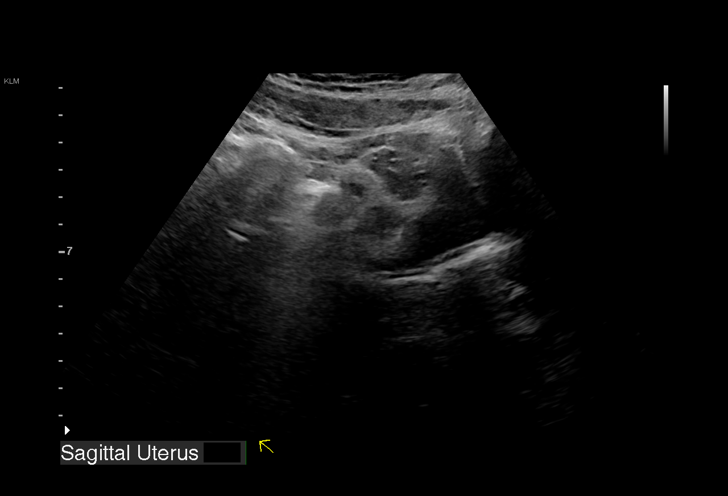
[im 5/33]
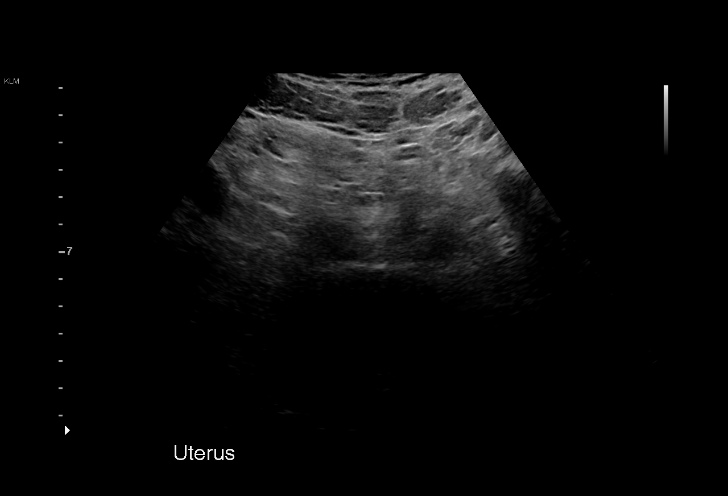
[im 8/33]
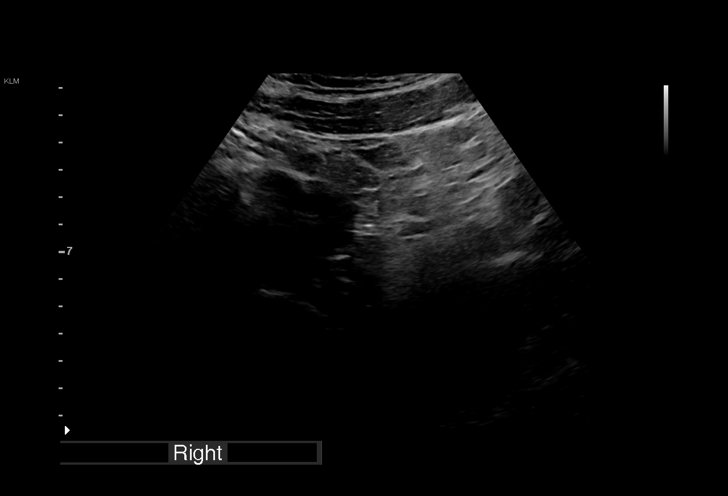
[im 10/33]
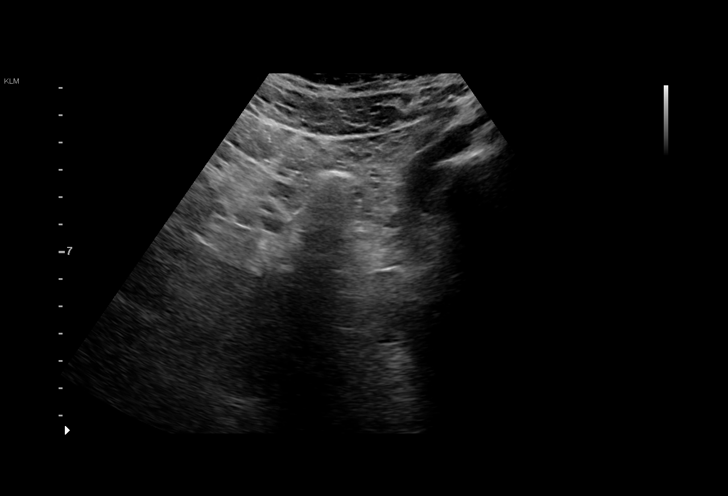
[im 12/33]
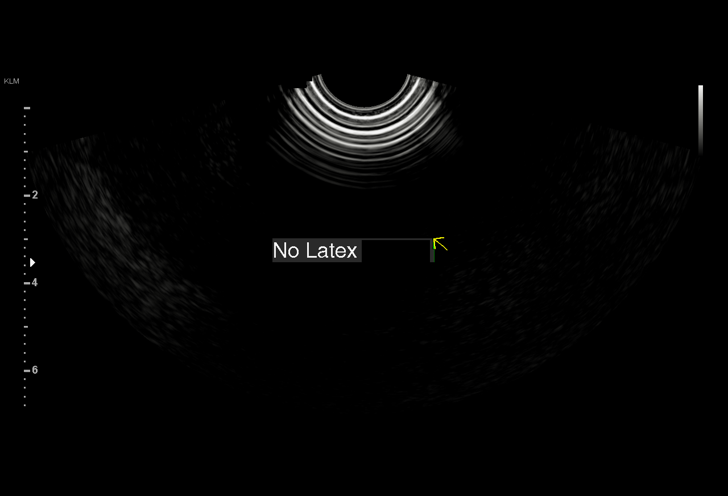
[im 15/33]
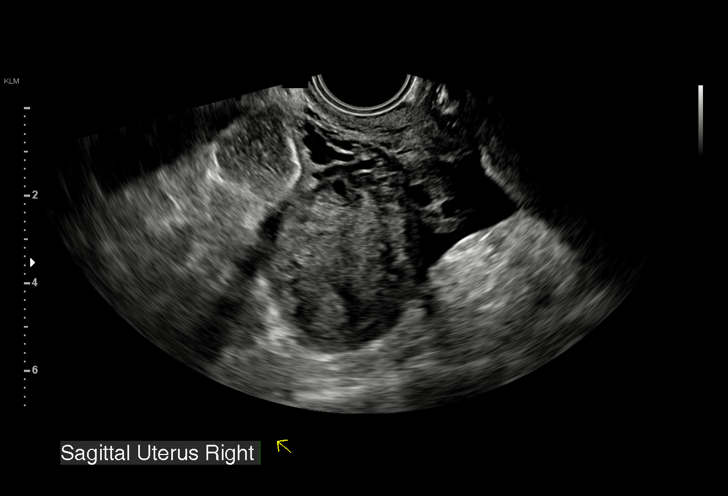
[im 17/33]
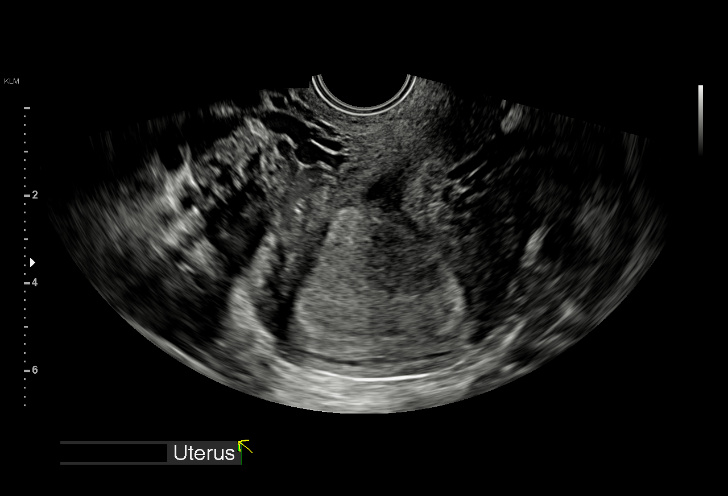
[im 18/33]
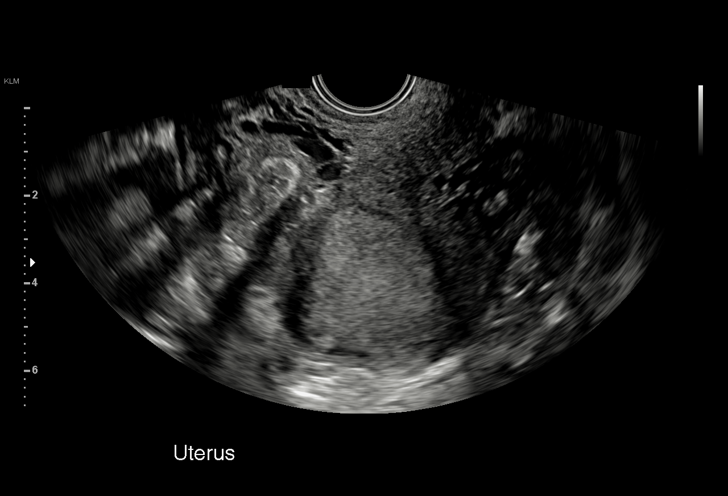
[im 21/33]
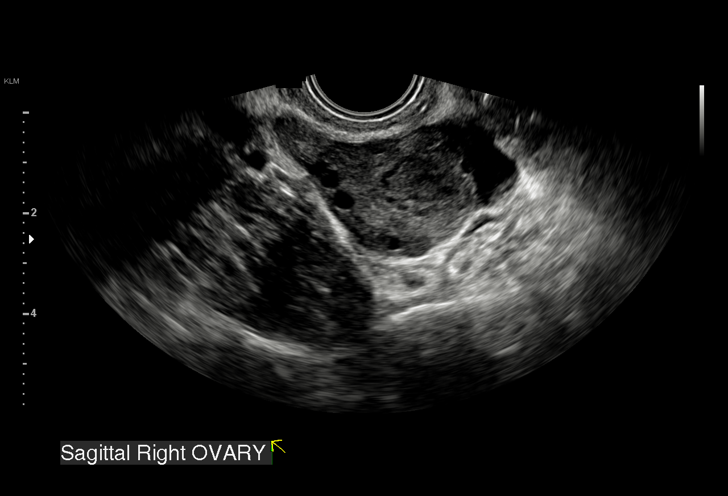
[im 23/33]
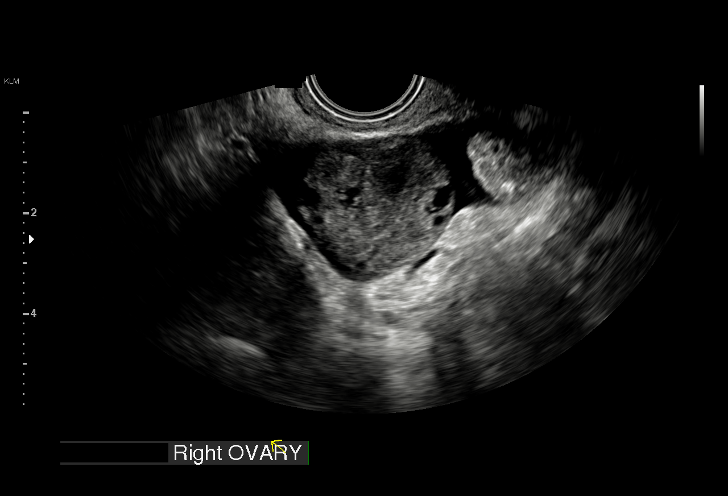
[im 25/33]
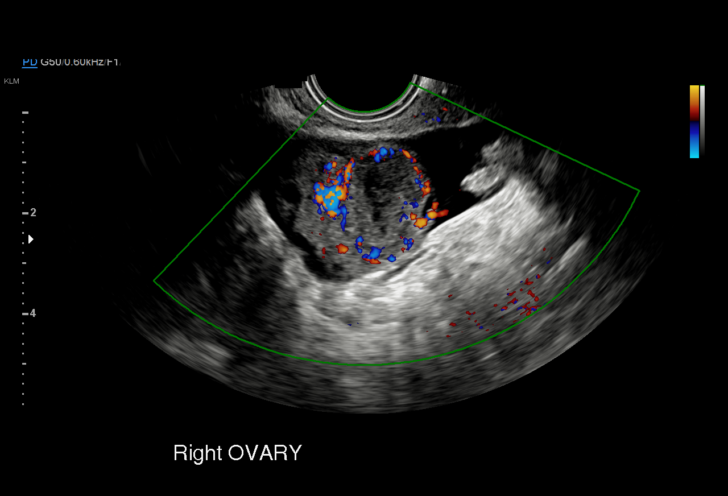
[im 28/33]
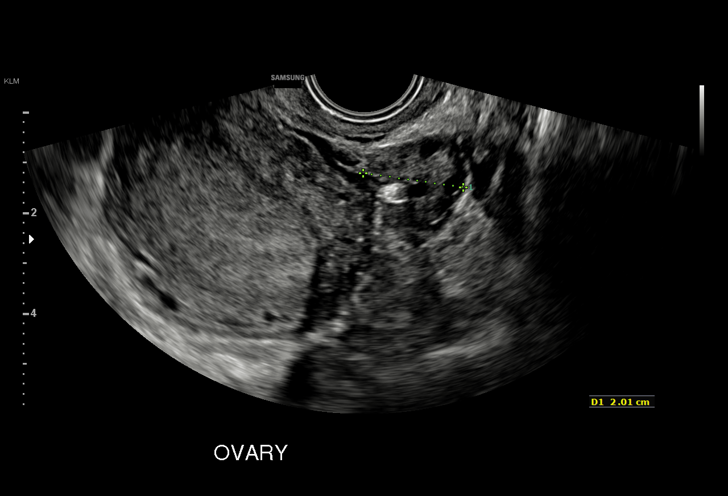
[im 30/33]
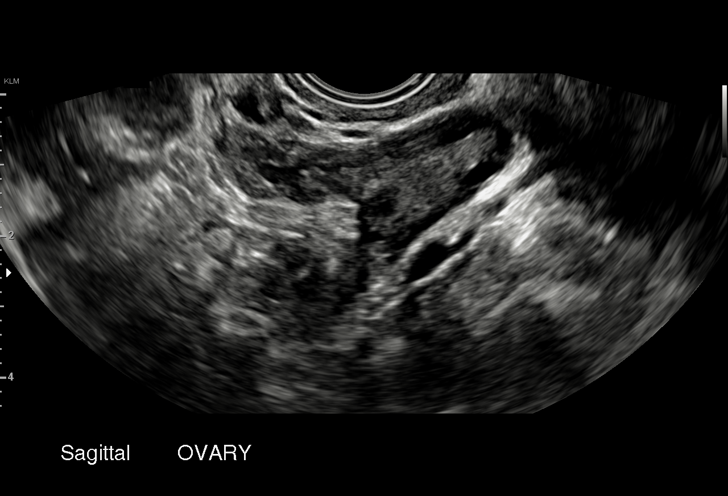
[im 33/33]
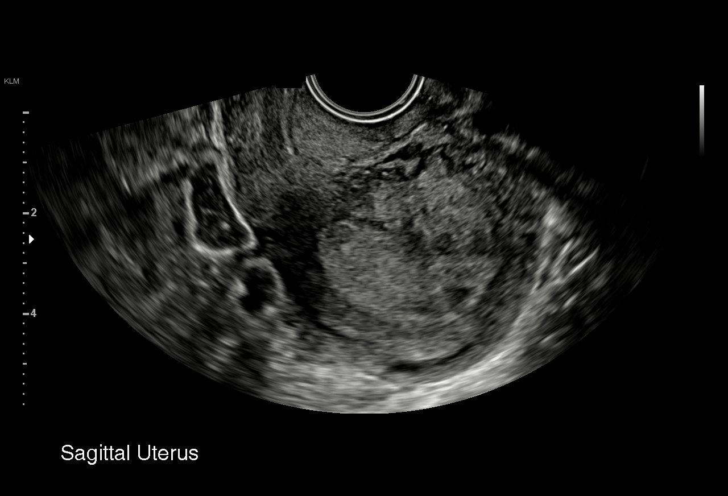

[15 of 28 positions shown; findings below may reference images not displayed]

FINDINGS: Intrauterine gestational sac: Not identified

Yolk sac:  Not identified

Embryo:  Not identified

Subchorionic hemorrhage:  None

Maternal uterus/adnexae: Corpus luteal cyst in the RIGHT ovary.
Normal LEFT ovary. Trace free fluid
IMPRESSION: No intrauterine gestational sac, yolk sac, or fetal pole identified.
Differential considerations include intrauterine pregnancy too early
to be sonographically visualized, missed abortion, or ectopic
pregnancy. Followup ultrasound is recommended in 10-14 days for
further evaluation.

## 2018-06-10 ENCOUNTER — Other Ambulatory Visit: Payer: Self-pay

## 2018-06-10 ENCOUNTER — Encounter (HOSPITAL_COMMUNITY): Payer: Self-pay | Admitting: *Deleted

## 2018-06-10 ENCOUNTER — Inpatient Hospital Stay (HOSPITAL_COMMUNITY)
Admission: AD | Admit: 2018-06-10 | Discharge: 2018-06-11 | Disposition: A | Discharge status: Home / Self Care | Payer: BLUE CROSS/BLUE SHIELD | Source: Ambulatory Visit | Attending: Obstetrics and Gynecology | Admitting: Obstetrics and Gynecology

## 2018-06-10 DIAGNOSIS — N760 Acute vaginitis: Secondary | ICD-10-CM | POA: Diagnosis not present

## 2018-06-10 DIAGNOSIS — O031 Delayed or excessive hemorrhage following incomplete spontaneous abortion: Secondary | ICD-10-CM | POA: Insufficient documentation

## 2018-06-10 DIAGNOSIS — O036 Delayed or excessive hemorrhage following complete or unspecified spontaneous abortion: Secondary | ICD-10-CM

## 2018-06-10 DIAGNOSIS — R102 Pelvic and perineal pain: Secondary | ICD-10-CM | POA: Diagnosis present

## 2018-06-10 DIAGNOSIS — O23591 Infection of other part of genital tract in pregnancy, first trimester: Secondary | ICD-10-CM | POA: Insufficient documentation

## 2018-06-10 DIAGNOSIS — N898 Other specified noninflammatory disorders of vagina: Secondary | ICD-10-CM | POA: Diagnosis present

## 2018-06-10 DIAGNOSIS — B9689 Other specified bacterial agents as the cause of diseases classified elsewhere: Secondary | ICD-10-CM | POA: Insufficient documentation

## 2018-06-10 DIAGNOSIS — O074 Failed attempted termination of pregnancy without complication: Secondary | ICD-10-CM | POA: Diagnosis not present

## 2018-06-10 LAB — URINALYSIS, ROUTINE W REFLEX MICROSCOPIC
BILIRUBIN URINE: NEGATIVE
GLUCOSE, UA: NEGATIVE mg/dL
Hgb urine dipstick: NEGATIVE
KETONES UR: NEGATIVE mg/dL
NITRITE: NEGATIVE
PH: 6 (ref 5.0–8.0)
PROTEIN: NEGATIVE mg/dL
Specific Gravity, Urine: 1.027 (ref 1.005–1.030)

## 2018-06-10 NOTE — MAU Note (Signed)
Pt had TAB on 6/14. Pt reports passing one pea sized bit of tissue 2 days ago. Pt has had vaginal itching for 3-4 day.  This evening Pt noticed an odor with her vaginal discharge. Pt having pinkish/brown spotting off and on also

## 2018-06-10 NOTE — MAU Provider Note (Addendum)
Chief Complaint: Pelvic Pain; Vaginal Discharge; and Vaginal Itching   First Provider Initiated Contact with Patient 06/11/18 0003      SUBJECTIVE HPI: Colleen Zamora is a 22 y.o. G3P0030 at 2 weeks S/P elective abortion on 05/26/18 who presents to Maternity Admissions reporting continued on and off bleeding, passage of tissue, vaginal odor an vaginal itching.  Had heavy bleeding for several days after the termination.  It lightened up and now she has small moderate amount of bleeding in the morning but stops during the day and returns the next morning.  Abortion performed at 12 weeks by [facility]. Called [facility] due to concern about bleeding and odor.  Was told she could come for a walk-in visit on Tuesdays or Thursdays, but got worried and did not want to wait until the next Tuesday.  IUP had been confirmed by [place] at [facility] 04/13/18 prior to termination. Has not resumed intercourse since termination.  Associated signs and symptoms: Negative for fever, chills, dizziness, malaise or body aches.  Positive for vaginal odor and itching.  Blood Type AB pos   Past Medical History:  Diagnosis Date  . Medical history non-contributory    OB History  Gravida Para Term Preterm AB Living  3       3    SAB TAB Ectopic Multiple Live Births  1 2          # Outcome Date GA Lbr Len/2nd Weight Sex Delivery Anes PTL Lv  3 TAB           2 SAB           1 TAB            Past Surgical History:  Procedure Laterality Date  . TONSILLECTOMY     Social History   Socioeconomic History  . Marital status: Single    Spouse name: Not on file  . Number of children: Not on file  . Years of education: Not on file  . Highest education level: Not on file  Occupational History  . Not on file  Social Needs  . Financial resource strain: Not on file  . Food insecurity:    Worry: Not on file    Inability: Not on file  . Transportation needs:    Medical: Not on file    Non-medical: Not on  file  Tobacco Use  . Smoking status: Never Smoker  . Smokeless tobacco: Never Used  Substance and Sexual Activity  . Alcohol use: Yes    Comment: social  . Drug use: No  . Sexual activity: Yes    Birth control/protection: None  Lifestyle  . Physical activity:    Days per week: Not on file    Minutes per session: Not on file  . Stress: Not on file  Relationships  . Social connections:    Talks on phone: Not on file    Gets together: Not on file    Attends religious service: Not on file    Active member of club or organization: Not on file    Attends meetings of clubs or organizations: Not on file    Relationship status: Not on file  . Intimate partner violence:    Fear of current or ex partner: Not on file    Emotionally abused: Not on file    Physically abused: Not on file    Forced sexual activity: Not on file  Other Topics Concern  . Not on file  Social History Narrative  . Not on file   Family History  Problem Relation Age of Onset  . Diabetes Mother    No current facility-administered medications on file prior to encounter.    No current outpatient medications on file prior to encounter.   Allergies  Allergen Reactions  . Ceclor [Cefaclor] Hives    I have reviewed patient's Past Medical Hx, Surgical Hx, Family Hx, Social Hx, medications and allergies.   Review of Systems  Constitutional: Negative for chills and fever.  Gastrointestinal: Positive for abdominal pain (Mild, sharp intermittent pain since termination.  None now.). Negative for nausea and vomiting.  Genitourinary: Positive for vaginal bleeding (Small, intermittent.  None now). Negative for vaginal discharge.       Positive for vaginal odor and vaginal itching.  Musculoskeletal: Negative for myalgias.  Neurological: Negative for dizziness.    OBJECTIVE Patient Vitals for the past 24 hrs:  BP Temp Temp src Pulse Resp SpO2 Height Weight  06/10/18 2245 129/68 98.7 F (37.1 C) Oral 78 18 99 % 5'  8" (1.727 m) 270 lb (122.5 kg)   Constitutional: Well-developed, well-nourished female in no acute distress.  Cardiovascular: normal rate Respiratory: normal rate and effort.  GI: Abd soft, non-tender. MS: Extremities nontender, no edema, normal ROM Neurologic: Alert and oriented x 4.  GU:  SPECULUM EXAM: NEFG, physiologic discharge, no blood noted, cervix with questionable ectropion--irregular in shape.  Patient reports significant discomfort with speculum exam, more than usual.  BIMANUAL: cervix closed; uterus retroverted, difficult to assess size due to position and body habitus, no adnexal tenderness or masses.  Mild CMT.  LAB RESULTS Results for orders placed or performed during the hospital encounter of 06/10/18 (from the past 24 hour(s))  Urinalysis, Routine w reflex microscopic     Status: Abnormal   Collection Time: 06/10/18 11:03 PM  Result Value Ref Range   Color, Urine YELLOW YELLOW   APPearance HAZY (A) CLEAR   Specific Gravity, Urine 1.027 1.005 - 1.030   pH 6.0 5.0 - 8.0   Glucose, UA NEGATIVE NEGATIVE mg/dL   Hgb urine dipstick NEGATIVE NEGATIVE   Bilirubin Urine NEGATIVE NEGATIVE   Ketones, ur NEGATIVE NEGATIVE mg/dL   Protein, ur NEGATIVE NEGATIVE mg/dL   Nitrite NEGATIVE NEGATIVE   Leukocytes, UA MODERATE (A) NEGATIVE   RBC / HPF 0-5 0 - 5 RBC/hpf   WBC, UA 11-20 0 - 5 WBC/hpf   Bacteria, UA RARE (A) NONE SEEN   Squamous Epithelial / LPF 11-20 0 - 5   Mucus PRESENT   Wet prep, genital     Status: Abnormal   Collection Time: 06/11/18 12:11 AM  Result Value Ref Range   Yeast Wet Prep HPF POC NONE SEEN NONE SEEN   Trich, Wet Prep NONE SEEN NONE SEEN   Clue Cells Wet Prep HPF POC PRESENT (A) NONE SEEN   WBC, Wet Prep HPF POC MANY (A) NONE SEEN   Sperm NONE SEEN   CBC with Differential/Platelet     Status: None   Collection Time: 06/11/18 12:59 AM  Result Value Ref Range   WBC 8.5 4.0 - 10.5 K/uL   RBC 4.14 3.87 - 5.11 MIL/uL   Hemoglobin 12.1 12.0 - 15.0  g/dL   HCT 96.0 45.4 - 09.8 %   MCV 88.6 78.0 - 100.0 fL   MCH 29.2 26.0 - 34.0 pg   MCHC 33.0 30.0 - 36.0 g/dL   RDW 11.9 14.7 - 82.9 %   Platelets 333  150 - 400 K/uL   Neutrophils Relative % 53 %   Neutro Abs 4.5 1.7 - 7.7 K/uL   Lymphocytes Relative 40 %   Lymphs Abs 3.4 0.7 - 4.0 K/uL   Monocytes Relative 5 %   Monocytes Absolute 0.4 0.1 - 1.0 K/uL   Eosinophils Relative 2 %   Eosinophils Absolute 0.2 0.0 - 0.7 K/uL   Basophils Relative 0 %   Basophils Absolute 0.0 0.0 - 0.1 K/uL  hCG, quantitative, pregnancy     Status: Abnormal   Collection Time: 06/11/18 12:59 AM  Result Value Ref Range   hCG, Beta Chain, Quant, S 39 (H) <5 mIU/mL    IMAGING US Ob Transvaginal  Result Date: 06/11/2018 CLINICAL DATA:  22 year old female with vaginal bleeding and passage of tissue. EXAM: TRANSVAGINAL OB ULTRASOUND TECHNIQUE: Transvaginal ultrasound was performed for complete evaluation of the gestation as well as the maternal uterus, adnexal regions, and pelvic cul-de-sac. COMPARISON:  Ultrasound dated 04/13/2018 FINDINGS: The uterus is retroflexed.  No intrauterine pregnancy identified. The endometrium is thickened measuring 16 mm. Echogenic endometrial content with vascularity is concerning for retained products of conception. Correlation with clinical exam and HCG levels recommended. Maternal uterus/adnexae: The ovaries are unremarkable. The right ovary measures 3.6 x 2.1 x 2.1 cm and the left ovary measures 3.0 x 1.5 x 1.8 cm. No significant free fluid within the pelvis. IMPRESSION: 1. Interval miscarriage of previously seen intrauterine pregnancy with findings suggestive of retained products of conception. Clinical correlation is recommended. 2. Unremarkable ovaries. Electronically Signed   By: Elgie Collard M.D.   On: 06/11/2018 01:22    MAU COURSE Orders Placed This Encounter  Procedures  . Wet prep, genital  . US OB Transvaginal  . Urinalysis, Routine w reflex microscopic  . CBC  with Differential/Platelet  . hCG, quantitative, pregnancy  . Discharge patient   Meds ordered this encounter  Medications  . misoprostol (CYTOTEC) tablet 800 mcg  . ibuprofen (ADVIL,MOTRIN) tablet 600 mg  . misoprostol (CYTOTEC) 200 MCG tablet    Sig: Insert four tablets vaginally if no passage within 24 hours after first dose.    Dispense:  4 tablet    Refill:  0    Order Specific Question:   Supervising Provider    Answer:   CONSTANT, PEGGY [[id number]]  . metroNIDAZOLE (FLAGYL) 500 MG tablet    Sig: Take 1 tablet (500 mg total) by mouth 2 (two) times daily.    Dispense:  14 tablet    Refill:  0    Order Specific Question:   Supervising Provider    Answer:   CONSTANT, PEGGY [[id number]]    MDM -Incomplete abortion.  Ultrasound suggestive of a small amount of retained products of conception.  Discussed history, exam, ultrasound, labs with Dr. Jolayne Panther.  Recommends Cytotec and to have patient follow-up with [facility] on Tuesday 06/13/2018.  Cytotec placed vaginally in maternity admissions.  Prescription sent to pharmacy for second dose in case tissue is not passed after first dose. Patient is stable and appropriate for outpatient management.  D&C not indicated. -Vaginal odor and irritation secondary to BV.  We will treat with Flagyl.  Patient instructed not to consume alcohol while on Flagyl.  ASSESSMENT 1. Incomplete legally induced abortion   2. Abortion complicated by delayed or excessive hemorrhage   3. BV (bacterial vaginosis)     PLAN Discharge home in stable condition. Incomplete abortion precautions Follow-up Information    [facility] Follow up on  06/13/2018.   Why:  Follow-up for incomplete abortion       [facility] Follow up.   Why:  As needed in OB/GYN emergencies Contact information: [address] [id number] [place] [id number] [phone]       [facility].   Specialty:  Obstetrics and Gynecology Why:  As needed for gynecology care Contact information: [address] [place] [id number] [phone]         Allergies as of 06/11/2018      Reactions   Ceclor [cefaclor] Hives      Medication List    TAKE these medications   metroNIDAZOLE 500 MG tablet Commonly known as:  FLAGYL Take 1 tablet (500 mg total) by mouth 2 (two) times daily.   misoprostol 200 MCG tablet Commonly known as:  CYTOTEC Insert four tablets vaginally if no passage within 24 hours after first dose.        Katrinka BlazingSmith, [place], CNM 06/11/2018  2:47 AM

## 2018-06-11 ENCOUNTER — Inpatient Hospital Stay (HOSPITAL_COMMUNITY): Payer: BLUE CROSS/BLUE SHIELD

## 2018-06-11 DIAGNOSIS — N760 Acute vaginitis: Secondary | ICD-10-CM | POA: Diagnosis not present

## 2018-06-11 DIAGNOSIS — B9689 Other specified bacterial agents as the cause of diseases classified elsewhere: Secondary | ICD-10-CM

## 2018-06-11 DIAGNOSIS — O074 Failed attempted termination of pregnancy without complication: Secondary | ICD-10-CM

## 2018-06-11 DIAGNOSIS — O036 Delayed or excessive hemorrhage following complete or unspecified spontaneous abortion: Secondary | ICD-10-CM

## 2018-06-11 DIAGNOSIS — O031 Delayed or excessive hemorrhage following incomplete spontaneous abortion: Secondary | ICD-10-CM | POA: Diagnosis not present

## 2018-06-11 LAB — CBC WITH DIFFERENTIAL/PLATELET
BASOS ABS: 0 10*3/uL (ref 0.0–0.1)
Basophils Relative: 0 %
EOS ABS: 0.2 10*3/uL (ref 0.0–0.7)
EOS PCT: 2 %
HCT: 36.7 % (ref 36.0–46.0)
Hemoglobin: 12.1 g/dL (ref 12.0–15.0)
LYMPHS ABS: 3.4 10*3/uL (ref 0.7–4.0)
Lymphocytes Relative: 40 %
MCH: 29.2 pg (ref 26.0–34.0)
MCHC: 33 g/dL (ref 30.0–36.0)
MCV: 88.6 fL (ref 78.0–100.0)
MONO ABS: 0.4 10*3/uL (ref 0.1–1.0)
Monocytes Relative: 5 %
Neutro Abs: 4.5 10*3/uL (ref 1.7–7.7)
Neutrophils Relative %: 53 %
PLATELETS: 333 10*3/uL (ref 150–400)
RBC: 4.14 MIL/uL (ref 3.87–5.11)
RDW: 13.1 % (ref 11.5–15.5)
WBC: 8.5 10*3/uL (ref 4.0–10.5)

## 2018-06-11 LAB — WET PREP, GENITAL
SPERM: NONE SEEN
TRICH WET PREP: NONE SEEN
YEAST WET PREP: NONE SEEN

## 2018-06-11 LAB — HCG, QUANTITATIVE, PREGNANCY: HCG, BETA CHAIN, QUANT, S: 39 m[IU]/mL — AB (ref <5)

## 2018-06-11 MED ORDER — MISOPROSTOL 200 MCG PO TABS: ORAL_TABLET | ORAL | 0 refills | Status: DC

## 2018-06-11 MED ORDER — MISOPROSTOL 200 MCG PO TABS
800.0000 ug | ORAL_TABLET | Freq: Once | ORAL | Status: AC
  Administered 2018-06-11: 800 ug via VAGINAL
  Filled 2018-06-11: qty 4

## 2018-06-11 MED ORDER — METRONIDAZOLE 500 MG PO TABS: 500.0000 mg | ORAL_TABLET | Freq: Two times a day (BID) | ORAL | 0 refills | Status: DC

## 2018-06-11 MED ORDER — IBUPROFEN 600 MG PO TABS
600.0000 mg | ORAL_TABLET | Freq: Once | ORAL | Status: AC
  Administered 2018-06-11: 600 mg via ORAL
  Filled 2018-06-11: qty 1

## 2018-06-11 NOTE — Discharge Instructions (Signed)
Prostaglandin-Induced Abortion, Care After Refer to this sheet in the next few weeks. These instructions provide you with information on caring for yourself after your procedure. Your health care provider may also give you more specific instructions. Your treatment has been planned according to current medical practices, but problems sometimes occur. Call your health care provider if you have any problems or questions after your procedure. What can I expect after the procedure?  You may have cramps for a few days. They may feel like menstrual cramps.  You may bleed for a few hours or a few days. It may feel like you are having a heavy period.  You may have a headache, diarrhea, or chills for 1-2 days.  You may feel tired and depressed.  Your next menstrual period will most likely start 4-6 weeks after the procedure, unless your health care provider has started you on birth control pills. Follow these instructions at home:  Only take over-the-counter or prescription medicines as directed by your health care provider.  Only take the medicines your health care provider recommends. Do not take aspirin. It can cause bleeding.  Keep track of how many menstrual pads you use each day and how soaked they are. Write down this information.  Rest and avoid strenuous activity for 2-3 weeks.  Do not have sexual intercourse for 2-3 weeks or until your health care provider says it is okay.  Do not douche or use tampons.  Ask your health care provider when you can start using birth control (contraception).  Keep all your follow-up appointments. Contact a health care provider if:  You have chills or fever.  You need to change your pad more often than every 2-4 hours.  You continue to have pain.  You have vaginal drainage.  You have pain or bleeding that gets worse. Get help right away if:  You have very bad cramps in your stomach, back, or abdomen.  You have a fever.  There are large  blood clots or tissue coming out of your vagina. Save any tissue for your health care provider to inspect.  You need to change your pad every hour or more than once in an hour.  You become light-headed, weak, or faint. This information is not intended to replace advice given to you by your health care provider. Make sure you discuss any questions you have with your health care provider. Document Released: 12/04/2013 Document Revised: 05/06/2016 Document Reviewed: 10/24/2013 Elsevier Interactive Patient Education  2018 [employer].   Bacterial Vaginosis Bacterial vaginosis is a vaginal infection that occurs when the normal balance of bacteria in the vagina is disrupted. It results from an overgrowth of certain bacteria. This is the most common vaginal infection among women ages 93-44. Because bacterial vaginosis increases your risk for STIs (sexually transmitted infections), getting treated can help reduce your risk for chlamydia, gonorrhea, herpes, and HIV (human immunodeficiency virus). Treatment is also important for preventing complications in pregnant women, because this condition can cause an early (premature) delivery. What are the causes? This condition is caused by an increase in harmful bacteria that are normally present in small amounts in the vagina. However, the reason that the condition develops is not fully understood. What increases the risk? The following factors may make you more likely to develop this condition:  Having a new sexual partner or multiple sexual partners.  Having unprotected sex.  Douching.  Having an intrauterine device (IUD).  Smoking.  Drug and alcohol abuse.  Taking certain  antibiotic medicines.  Being pregnant.  You cannot get bacterial vaginosis from toilet seats, bedding, swimming pools, or contact with objects around you. What are the signs or symptoms? Symptoms of this condition include:  Grey or white vaginal discharge. The discharge  can also be watery or foamy.  A fish-like odor with discharge, especially after sexual intercourse or during menstruation.  Itching in and around the vagina.  Burning or pain with urination.  Some women with bacterial vaginosis have no signs or symptoms. How is this diagnosed? This condition is diagnosed based on:  Your medical history.  A physical exam of the vagina.  Testing a sample of vaginal fluid under a microscope to look for a large amount of bad bacteria or abnormal cells. Your health care provider may use a cotton swab or a small wooden spatula to collect the sample.  How is this treated? This condition is treated with antibiotics. These may be given as a pill, a vaginal cream, or a medicine that is put into the vagina (suppository). If the condition comes back after treatment, a second round of antibiotics may be needed. Follow these instructions at home: Medicines  Take over-the-counter and prescription medicines only as told by your health care provider.  Take or use your antibiotic as told by your health care provider. Do not stop taking or using the antibiotic even if you start to feel better. General instructions  If you have a female sexual partner, tell her that you have a vaginal infection. She should see her health care provider and be treated if she has symptoms. If you have a female sexual partner, he does not need treatment.  During treatment: ? Avoid sexual activity until you finish treatment. ? Do not douche. ? Avoid alcohol as directed by your health care provider. ? Avoid breastfeeding as directed by your health care provider.  Drink enough water and fluids to keep your urine clear or pale yellow.  Keep the area around your vagina and rectum clean. ? Wash the area daily with warm water. ? Wipe yourself from front to back after using the toilet.  Keep all follow-up visits as told by your health care provider. This is important. How is this  prevented?  Do not douche.  Wash the outside of your vagina with warm water only.  Use protection when having sex. This includes latex condoms and dental dams.  Limit how many sexual partners you have. To help prevent bacterial vaginosis, it is best to have sex with just one partner (monogamous).  Make sure you and your sexual partner are tested for STIs.  Wear cotton or cotton-lined underwear.  Avoid wearing tight pants and pantyhose, especially during summer.  Limit the amount of alcohol that you drink.  Do not use any products that contain nicotine or tobacco, such as cigarettes and e-cigarettes. If you need help quitting, ask your health care provider.  Do not use illegal drugs. Where to find more information:  Centers for Disease Control and Prevention: SolutionApps.co.zawww.cdc.gov/std  American Sexual Health Association (ASHA): www.ashastd.org  U.S. Department of Health and Health and safety inspectorHuman Services, Office on Women's Health: ConventionalMedicines.siwww.womenshealth.gov/ or http://www.anderson-williamson.info/https://www.womenshealth.gov/a-z-topics/bacterial-vaginosis Contact a health care provider if:  Your symptoms do not improve, even after treatment.  You have more discharge or pain when urinating.  You have a fever.  You have pain in your abdomen.  You have pain during sex.  You have vaginal bleeding between periods. Summary  Bacterial vaginosis is a vaginal infection that occurs when the  normal balance of bacteria in the vagina is disrupted.  Because bacterial vaginosis increases your risk for STIs (sexually transmitted infections), getting treated can help reduce your risk for chlamydia, gonorrhea, herpes, and HIV (human immunodeficiency virus). Treatment is also important for preventing complications in pregnant women, because the condition can cause an early (premature) delivery.  This condition is treated with antibiotic medicines. These may be given as a pill, a vaginal cream, or a medicine that is put into the vagina (suppository). This  information is not intended to replace advice given to you by your health care provider. Make sure you discuss any questions you have with your health care provider. Document Released: 11/29/2005 Document Revised: 04/04/2017 Document Reviewed: 08/14/2016 Elsevier Interactive Patient Education  [employer].

## 2018-06-12 LAB — GC/CHLAMYDIA PROBE AMP (~~LOC~~) NOT AT ARMC
Chlamydia: NEGATIVE
NEISSERIA GONORRHEA: NEGATIVE

## 2018-07-21 IMAGING — US US OB TRANSVAGINAL
1 series · 15 of 28 positions shown · non-contrast
Comparison: Ultrasound dated 04/13/2018

CLINICAL DATA: 22-year-old female with vaginal bleeding and passage
of tissue.

EXAM:
TRANSVAGINAL OB ULTRASOUND
TECHNIQUE: Transvaginal ultrasound was performed for complete evaluation of the
gestation as well as the maternal uterus, adnexal regions, and
pelvic cul-de-sac.

[Series 1: us ob transvaginal · 30 acquisitions, 15 frames shown]
[im 1/30]
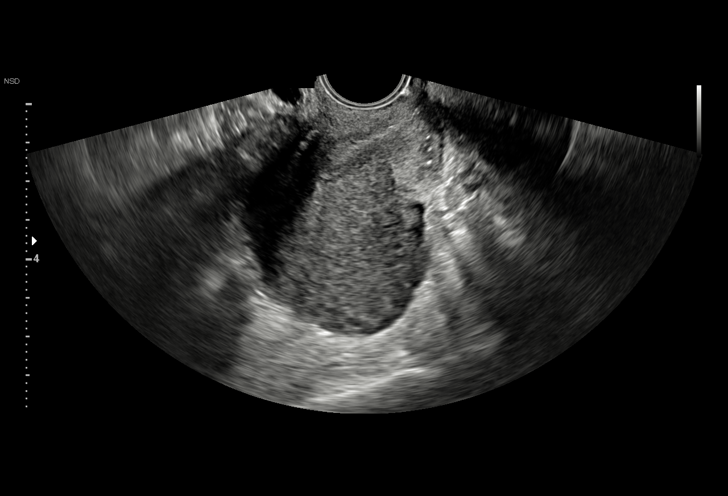
[im 3/30]
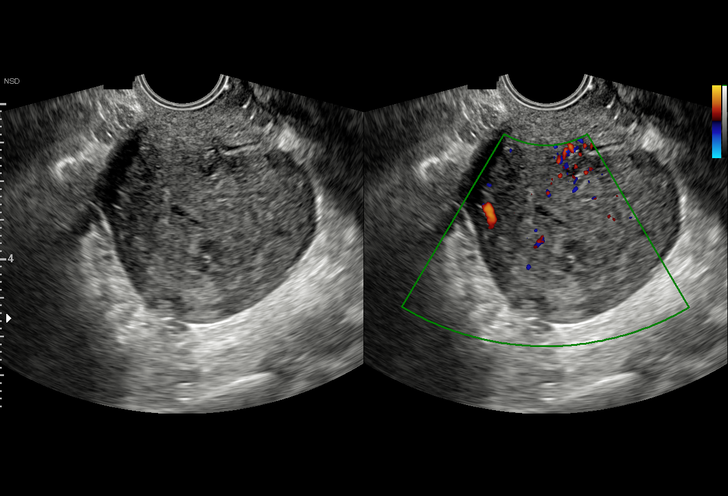
[im 5/30]
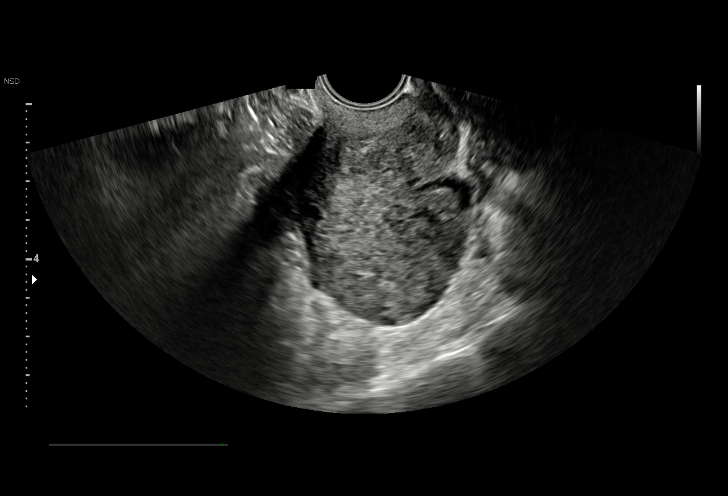
[im 7/30]
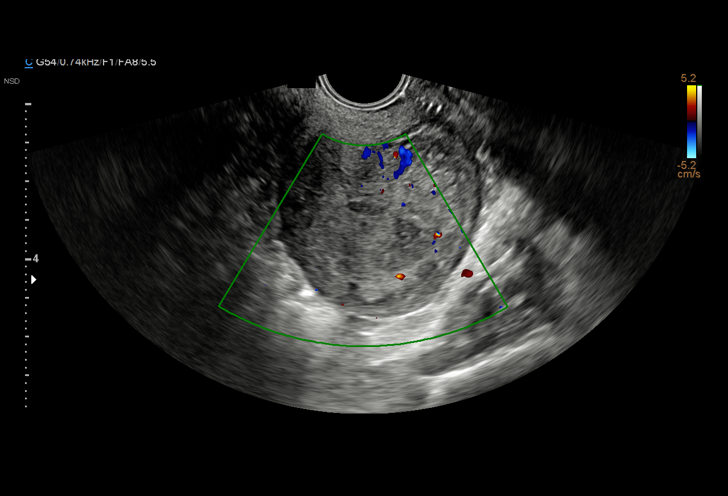
[im 9/30]
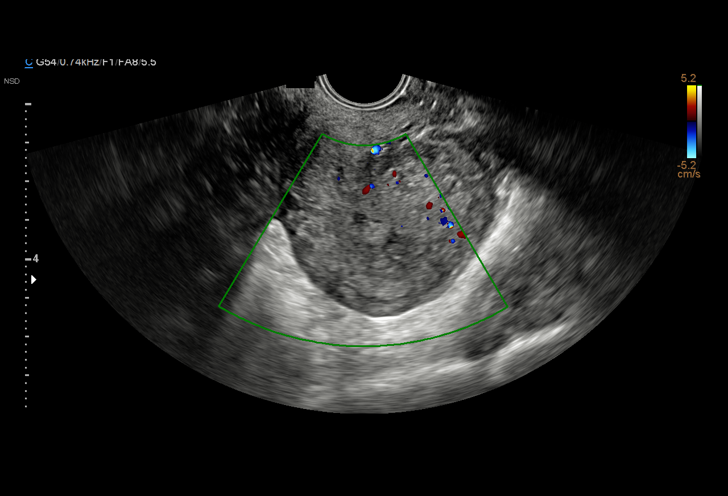
[im 11/30]
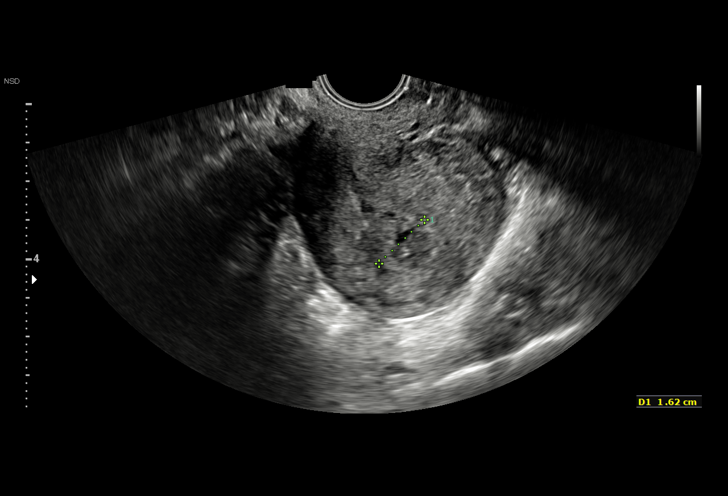
[im 13/30]
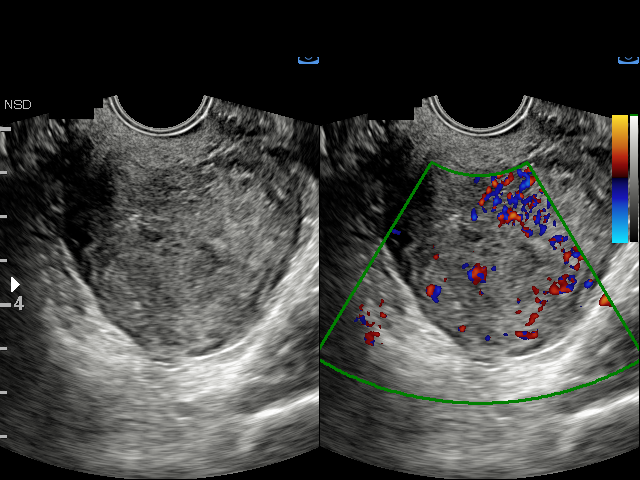
[im 16/30]
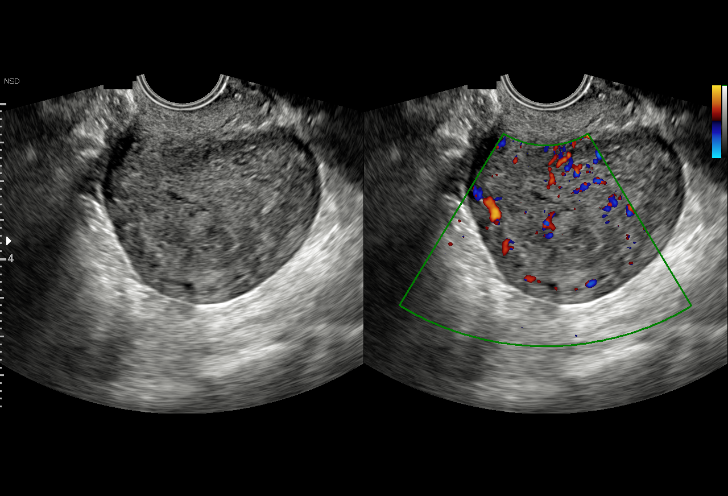
[im 17/30]
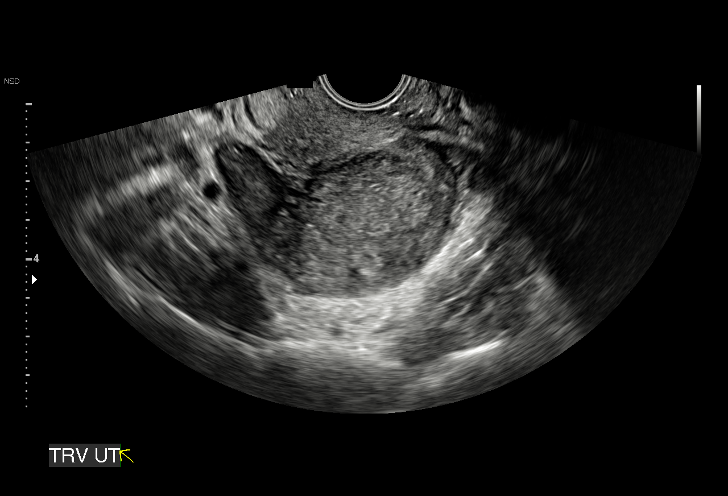
[im 19/30]
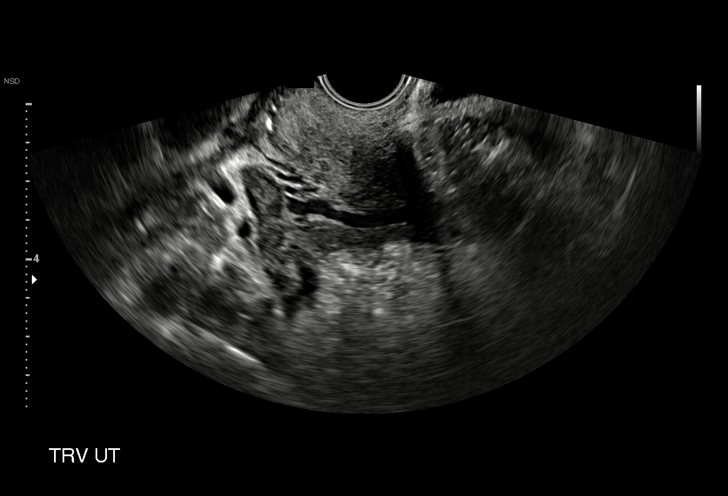
[im 21/30]
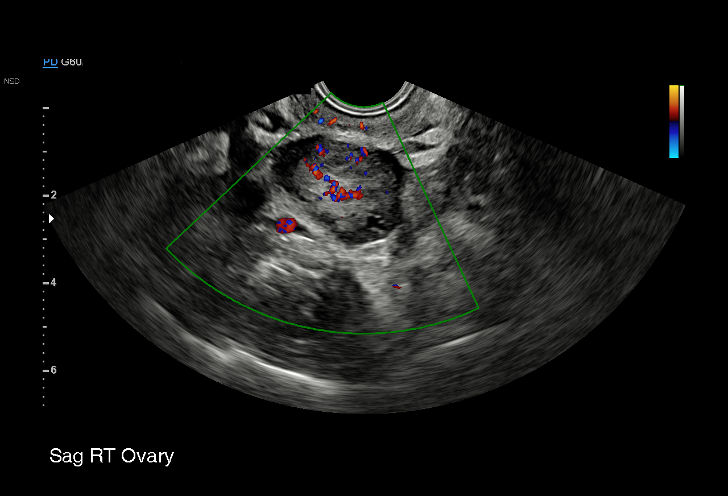
[im 23/30]
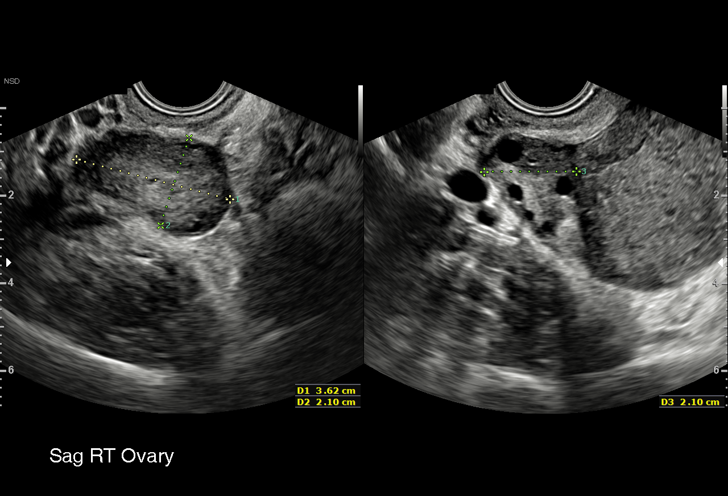
[im 25/30]
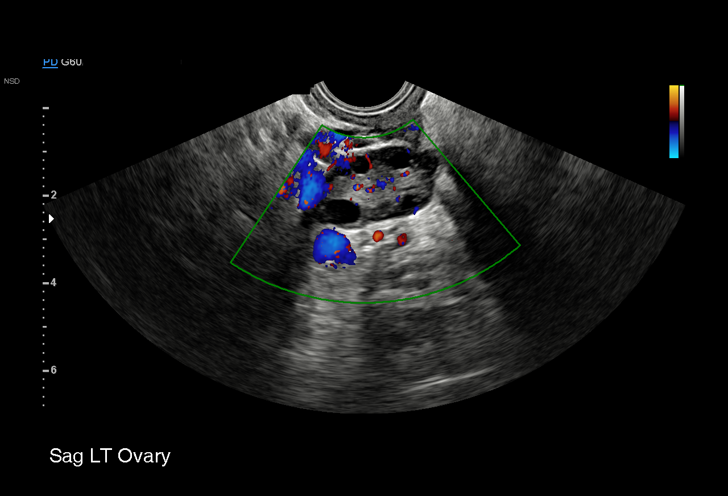
[im 27/30]
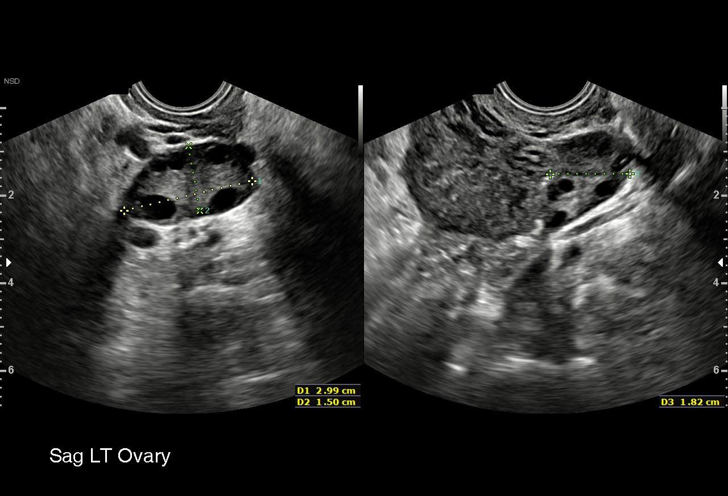
[im 30/30]
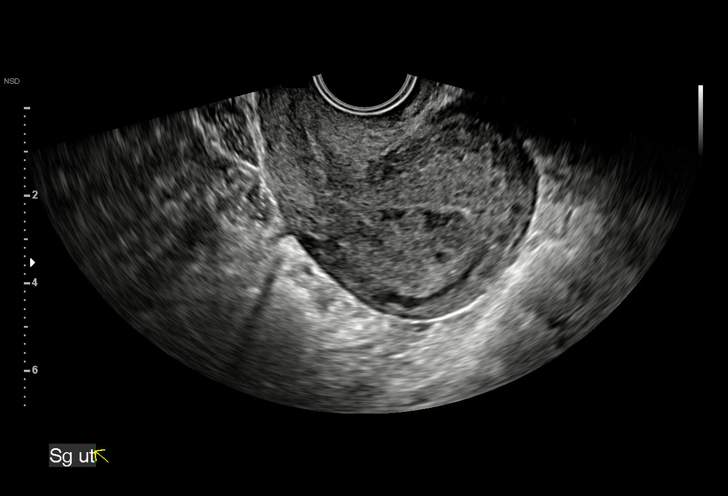

[15 of 28 positions shown; findings below may reference images not displayed]

FINDINGS: The uterus is retroflexed.  No intrauterine pregnancy identified.

The endometrium is thickened measuring 16 mm. Echogenic endometrial
content with vascularity is concerning for retained products of
conception. Correlation with clinical exam and HCG levels
recommended.

Maternal uterus/adnexae: The ovaries are unremarkable. The right
ovary measures 3.6 x 2.1 x 2.1 cm and the left ovary measures 3.0 x
1.5 x 1.8 cm.

No significant free fluid within the pelvis.
IMPRESSION: 1. Interval miscarriage of previously seen intrauterine pregnancy
with findings suggestive of retained products of conception.
Clinical correlation is recommended.
2. Unremarkable ovaries.

## 2018-12-13 NOTE — L&D Delivery Note (Signed)
Delivery Note Pt pushed very well for delivery.  At 3:45 PM a viable and healthy female was delivered via Vaginal, Spontaneous (Presentation: OA; LOT ).  APGAR: 9, 8; weight P .   Placenta status: delivered, intact .  Cord: 3V with the following complications: none.    Anesthesia:  epidural Episiotomy: None Lacerations: 2nd degree;Perineal Suture Repair: 3.0 vicryl rapide Est. Blood Loss (mL): 105cc  Mom to postpartum.  Baby to Couplet care / Skin to Skin.  Colleen Zamora 09/30/2019, 4:57 PM  D/w parents circumcision for female infant inc r/b/a  AB+/RI/Tdap/Br/Contra?

## 2019-02-10 ENCOUNTER — Inpatient Hospital Stay (HOSPITAL_COMMUNITY)
Admission: AD | Admit: 2019-02-10 | Discharge: 2019-02-10 | Disposition: A | Discharge status: Home / Self Care | Payer: Medicaid Other | Source: Ambulatory Visit | Attending: Obstetrics and Gynecology | Admitting: Obstetrics and Gynecology

## 2019-02-10 ENCOUNTER — Encounter (HOSPITAL_COMMUNITY): Payer: Self-pay

## 2019-02-10 DIAGNOSIS — B9689 Other specified bacterial agents as the cause of diseases classified elsewhere: Secondary | ICD-10-CM | POA: Insufficient documentation

## 2019-02-10 DIAGNOSIS — O26891 Other specified pregnancy related conditions, first trimester: Secondary | ICD-10-CM | POA: Diagnosis not present

## 2019-02-10 DIAGNOSIS — Z3A01 Less than 8 weeks gestation of pregnancy: Secondary | ICD-10-CM

## 2019-02-10 DIAGNOSIS — Z3201 Encounter for pregnancy test, result positive: Secondary | ICD-10-CM | POA: Insufficient documentation

## 2019-02-10 DIAGNOSIS — N760 Acute vaginitis: Secondary | ICD-10-CM | POA: Insufficient documentation

## 2019-02-10 DIAGNOSIS — N898 Other specified noninflammatory disorders of vagina: Secondary | ICD-10-CM | POA: Diagnosis present

## 2019-02-10 LAB — WET PREP, GENITAL
CLUE CELLS WET PREP: NONE SEEN
Sperm: NONE SEEN
TRICH WET PREP: NONE SEEN
Yeast Wet Prep HPF POC: NONE SEEN

## 2019-02-10 LAB — URINALYSIS, ROUTINE W REFLEX MICROSCOPIC
BILIRUBIN URINE: NEGATIVE
Glucose, UA: NEGATIVE mg/dL
Hgb urine dipstick: NEGATIVE
Ketones, ur: 5 mg/dL — AB
LEUKOCYTE UA: NEGATIVE
NITRITE: NEGATIVE
PH: 5 (ref 5.0–8.0)
PROTEIN: NEGATIVE mg/dL
Specific Gravity, Urine: 1.032 — ABNORMAL HIGH (ref 1.005–1.030)

## 2019-02-10 LAB — POCT PREGNANCY, URINE: PREG TEST UR: POSITIVE — AB

## 2019-02-10 MED ORDER — METRONIDAZOLE 500 MG PO TABS: 500.0000 mg | ORAL_TABLET | Freq: Two times a day (BID) | ORAL | 0 refills | Status: DC

## 2019-02-10 NOTE — Discharge Instructions (Signed)
[facility]  ° ° °[facility]       Phone: [phone] ° °[facility] Phone: [phone] ° °[facility] [place]  Phone: [phone] ° °[facility] [place] [facility]  Phone: [phone] ° °[facility]  Phone: [phone] ° °[facility]       Phone: [phone] ° °[facility]    Phone: [phone]  ° °[facility] ([place])    Phone: [phone] ° °[facility]    Phone: [phone] ° °[facility]    Phone: [phone] ° °[facility]    Phone: [phone] ° °[facility]       Phone: [phone]  ° °[facility]  Phone: [phone] ° °[facility]    Phone: [phone] ° °[facility]    Phone: [phone] ° °[facility]      Phone: [phone] ° °[facility]    Phone: [phone] ° °[facility] °         °[facility]  ° Phone: [phone] ° °[facility]  ° Phone: [phone] ° °[facility]      ° Phone: [phone]   °         °[facility] [place]    ° Phone: [phone] °         °[facility] [place]  ° Phone: [phone] ° °[facility] ° Phone: [phone] °    °[facility] ° Phone: [phone] °Safe Medications in Pregnancy  ° °Acne: °Benzoyl Peroxide °Salicylic Acid ° °Backache/Headache: °Tylenol: 2 regular strength every 4 hours OR °             2 Extra strength every 6 hours ° °Colds/Coughs/Allergies: °Benadryl (alcohol free) 25 mg every 6 hours as needed °Breath right  strips °Claritin °Cepacol throat lozenges °Chloraseptic throat spray °Cold-Eeze- up to three times per day °Cough drops, alcohol free °Flonase (by prescription only) °Guaifenesin °Mucinex °Robitussin DM (plain only, alcohol free) °Saline nasal spray/drops °Sudafed (pseudoephedrine) & Actifed ** use only after [redacted] weeks gestation and if you do not have high blood pressure °Tylenol °Vicks Vaporub °Zinc lozenges °Zyrtec  ° °Constipation: °Colace °Ducolax suppositories °Fleet enema °Glycerin suppositories °Metamucil °Milk of magnesia °Miralax °Senokot °Smooth move tea ° °Diarrhea: °Kaopectate °Imodium A-D ° °*NO pepto Bismol ° °Hemorrhoids: °Anusol °Anusol HC °Preparation H °Tucks ° °Indigestion: °Tums °Maalox °Mylanta °Zantac  °Pepcid ° °Insomnia: °Benadryl (alcohol free) 25mg every 6 hours as needed °Tylenol PM °Unisom, no Gelcaps ° °Leg Cramps: °Tums °MagGel ° °Nausea/Vomiting:  °Bonine °Dramamine °Emetrol °Ginger extract °Sea bands °Meclizine  °Nausea medication to take during pregnancy:  °Unisom (doxylamine succinate 25 mg tablets) Take one tablet daily at bedtime. If symptoms are not adequately controlled, the dose can be increased to a maximum recommended dose of two tablets daily (1/2 tablet in the morning, 1/2 tablet mid-afternoon and one at bedtime). °Vitamin B6 100mg tablets. Take one tablet twice a day (up to 200 mg per day). ° °Skin Rashes: °Aveeno products °Benadryl cream or 25mg every 6 hours as needed °Calamine Lotion °1% cortisone cream ° °Yeast infection: °Gyne-lotrimin 7 °Monistat 7 ° ° °**If taking multiple medications, please check   labels to avoid duplicating the same active ingredients **take medication as directed on the label ** Do not exceed 4000 mg of tylenol in 24 hours **Do not take medications that contain aspirin or ibuprofen     First Trimester of Pregnancy The first trimester of pregnancy is from week 1 until the end of week 13 (months 1 through 3). A week after a sperm fertilizes  an egg, the egg will implant on the wall of the uterus. This embryo will begin to develop into a baby. Genes from you and your partner will form the baby. The female genes will determine whether the baby will be a boy or a girl. At 6-8 weeks, the eyes and face will be formed, and the heartbeat can be seen on ultrasound. At the end of 12 weeks, all the baby's organs will be formed. Now that you are pregnant, you will want to do everything you can to have a healthy baby. Two of the most important things are to get good prenatal care and to follow your health care provider's instructions. Prenatal care is all the medical care you receive before the baby's birth. This care will help prevent, find, and treat any problems during the pregnancy and childbirth. Body changes during your first trimester Your body goes through many changes during pregnancy. The changes vary from woman to woman.  You may gain or lose a couple of pounds at first.  You may feel sick to your stomach (nauseous) and you may throw up (vomit). If the vomiting is uncontrollable, call your health care provider.  You may tire easily.  You may develop headaches that can be relieved by medicines. All medicines should be approved by your health care provider.  You may urinate more often. Painful urination may mean you have a bladder infection.  You may develop heartburn as a result of your pregnancy.  You may develop constipation because certain hormones are causing the muscles that push stool through your intestines to slow down.  You may develop hemorrhoids or swollen veins (varicose veins).  Your breasts may begin to grow larger and become tender. Your nipples may stick out more, and the tissue that surrounds them (areola) may become darker.  Your gums may bleed and may be sensitive to brushing and flossing.  Dark spots or blotches (chloasma, mask of pregnancy) may develop on your face. This will likely fade after the baby is  born.  Your menstrual periods will stop.  You may have a loss of appetite.  You may develop cravings for certain kinds of food.  You may have changes in your emotions from day to day, such as being excited to be pregnant or being concerned that something may go wrong with the pregnancy and baby.  You may have more vivid and strange dreams.  You may have changes in your hair. These can include thickening of your hair, rapid growth, and changes in texture. Some women also have hair loss during or after pregnancy, or hair that feels dry or thin. Your hair will most likely return to normal after your baby is born. What to expect at prenatal visits During a routine prenatal visit:  You will be weighed to make sure you and the baby are growing normally.  Your blood pressure will be taken.  Your abdomen will be measured to track your baby's growth.  The fetal heartbeat will be listened to between weeks 10 and 14 of your pregnancy.  Test results from any previous visits  will be discussed. Your health care provider may ask you:  How you are feeling.  If you are feeling the baby move.  If you have had any abnormal symptoms, such as leaking fluid, bleeding, severe headaches, or abdominal cramping.  If you are using any tobacco products, including cigarettes, chewing tobacco, and electronic cigarettes.  If you have any questions. Other tests that may be performed during your first trimester include:  Blood tests to find your blood type and to check for the presence of any previous infections. The tests will also be used to check for low iron levels (anemia) and protein on red blood cells (Rh antibodies). Depending on your risk factors, or if you previously had diabetes during pregnancy, you may have tests to check for high blood sugar that affects pregnant women (gestational diabetes).  Urine tests to check for infections, diabetes, or protein in the urine.  An ultrasound to confirm the  proper growth and development of the baby.  Fetal screens for spinal cord problems (spina bifida) and Down syndrome.  HIV (human immunodeficiency virus) testing. Routine prenatal testing includes screening for HIV, unless you choose not to have this test.  You may need other tests to make sure you and the baby are doing well. Follow these instructions at home: Medicines  Follow your health care provider's instructions regarding medicine use. Specific medicines may be either safe or unsafe to take during pregnancy.  Take a prenatal vitamin that contains at least 600 micrograms (mcg) of folic acid.  If you develop constipation, try taking a stool softener if your health care provider approves. Eating and drinking   Eat a balanced diet that includes fresh fruits and vegetables, whole grains, good sources of protein such as meat, eggs, or tofu, and low-fat dairy. Your health care provider will help you determine the amount of weight gain that is right for you.  Avoid raw meat and uncooked cheese. These carry germs that can cause birth defects in the baby.  Eating four or five small meals rather than three large meals a day may help relieve nausea and vomiting. If you start to feel nauseous, eating a few soda crackers can be helpful. Drinking liquids between meals, instead of during meals, also seems to help ease nausea and vomiting.  Limit foods that are high in fat and processed sugars, such as fried and sweet foods.  To prevent constipation: ? Eat foods that are high in fiber, such as fresh fruits and vegetables, whole grains, and beans. ? Drink enough fluid to keep your urine clear or pale yellow. Activity  Exercise only as directed by your health care provider. Most women can continue their usual exercise routine during pregnancy. Try to exercise for 30 minutes at least 5 days a week. Exercising will help you: ? Control your weight. ? Stay in shape. ? Be prepared for labor and  delivery.  Experiencing pain or cramping in the lower abdomen or lower back is a good sign that you should stop exercising. Check with your health care provider before continuing with normal exercises.  Try to avoid standing for long periods of time. Move your legs often if you must stand in one place for a long time.  Avoid heavy lifting.  Wear low-heeled shoes and practice good posture.  You may continue to have sex unless your health care provider tells you not to. Relieving pain and discomfort  Wear a good support bra to relieve breast tenderness.  Take warm sitz baths  to soothe any pain or discomfort caused by hemorrhoids. Use hemorrhoid cream if your health care provider approves.  Rest with your legs elevated if you have leg cramps or low back pain.  If you develop varicose veins in your legs, wear support hose. Elevate your feet for 15 minutes, 3-4 times a day. Limit salt in your diet. Prenatal care  Schedule your prenatal visits by the twelfth week of pregnancy. They are usually scheduled monthly at first, then more often in the last 2 months before delivery.  Write down your questions. Take them to your prenatal visits.  Keep all your prenatal visits as told by your health care provider. This is important. Safety  Wear your seat belt at all times when driving.  Make a list of emergency phone numbers, including numbers for family, friends, the hospital, and police and fire departments. General instructions  Ask your health care provider for a referral to a local prenatal education class. Begin classes no later than the beginning of month 6 of your pregnancy.  Ask for help if you have counseling or nutritional needs during pregnancy. Your health care provider can offer advice or refer you to specialists for help with various needs.  Do not use hot tubs, steam rooms, or saunas.  Do not douche or use tampons or scented sanitary pads.  Do not cross your legs for long  periods of time.  Avoid cat litter boxes and soil used by cats. These carry germs that can cause birth defects in the baby and possibly loss of the fetus by miscarriage or stillbirth.  Avoid all smoking, herbs, alcohol, and medicines not prescribed by your health care provider. Chemicals in these products affect the formation and growth of the baby.  Do not use any products that contain nicotine or tobacco, such as cigarettes and e-cigarettes. If you need help quitting, ask your health care provider. You may receive counseling support and other resources to help you quit.  Schedule a dentist appointment. At home, brush your teeth with a soft toothbrush and be gentle when you floss. Contact a health care provider if:  You have dizziness.  You have mild pelvic cramps, pelvic pressure, or nagging pain in the abdominal area.  You have persistent nausea, vomiting, or diarrhea.  You have a bad smelling vaginal discharge.  You have pain when you urinate.  You notice increased swelling in your face, hands, legs, or ankles.  You are exposed to fifth disease or chickenpox.  You are exposed to [place] measles (rubella) and have never had it. Get help right away if:  You have a fever.  You are leaking fluid from your vagina.  You have spotting or bleeding from your vagina.  You have severe abdominal cramping or pain.  You have rapid weight gain or loss.  You vomit blood or material that looks like coffee grounds.  You develop a severe headache.  You have shortness of breath.  You have any kind of trauma, such as from a fall or a car accident. Summary  The first trimester of pregnancy is from week 1 until the end of week 13 (months 1 through 3).  Your body goes through many changes during pregnancy. The changes vary from woman to woman.  You will have routine prenatal visits. During those visits, your health care provider will examine you, discuss any test results you may have,  and talk with you about how you are feeling. This information is not intended to replace advice  given to you by your health care provider. Make sure you discuss any questions you have with your health care provider. Document Released: 11/23/2001 Document Revised: 11/10/2016 Document Reviewed: 11/10/2016 Elsevier Interactive Patient Education  2019 [employer].

## 2019-02-10 NOTE — MAU Note (Addendum)
Pt came to see if she was pregnant, had one negative and one positive. Also unsure if she has BV, does have an odor. Was treated for BV the end of January.

## 2019-02-10 NOTE — MAU Provider Note (Signed)
Colleen Zamora is a 23 y.o. G4P0030 at 5 weeks 3 days who presents to MAU today for pregnancy verification. She reports one positive and one negative pregnancy test. The patient denies abdominal pain or vaginal bleeding today. She reports a discharge with a foul odor. She was treated for BV in January and thinks she has it again.  BP 138/79 (BP Location: Right Arm)   Pulse 95   Temp 98.3 F (36.8 C) (Oral)   Resp 16   Ht 5\' 8"  (1.727 m)   Wt 131.5 kg   LMP 01/03/2019   SpO2 99%   BMI 44.09 kg/m   CONSTITUTIONAL: Well-developed, well-nourished female in no acute distress.  ENT: External right and left ear normal.  EYES: EOM intact, conjunctivae normal.  MUSCULOSKELETAL: Normal range of motion.  CARDIOVASCULAR: Regular heart rate RESPIRATORY: Normal effort NEUROLOGICAL: Alert and oriented to person, place, and time.  SKIN: Skin is warm and dry. No rash noted. Not diaphoretic. No erythema. No pallor. PSYCH: Normal mood and affect. Normal behavior. Normal judgment and thought content.  Results for orders placed or performed during the hospital encounter of 02/10/19 (from the past 24 hour(s))  Urinalysis, Routine w reflex microscopic     Status: Abnormal   Collection Time: 02/10/19  6:10 PM  Result Value Ref Range   Color, Urine YELLOW YELLOW   APPearance CLEAR CLEAR   Specific Gravity, Urine 1.032 (H) 1.005 - 1.030   pH 5.0 5.0 - 8.0   Glucose, UA NEGATIVE NEGATIVE mg/dL   Hgb urine dipstick NEGATIVE NEGATIVE   Bilirubin Urine NEGATIVE NEGATIVE   Ketones, ur 5 (A) NEGATIVE mg/dL   Protein, ur NEGATIVE NEGATIVE mg/dL   Nitrite NEGATIVE NEGATIVE   Leukocytes,Ua NEGATIVE NEGATIVE  Pregnancy, urine POC     Status: Abnormal   Collection Time: 02/10/19  6:10 PM  Result Value Ref Range   Preg Test, Ur POSITIVE (A) NEGATIVE  Wet prep, genital     Status: Abnormal   Collection Time: 02/10/19  6:19 PM  Result Value Ref Range   Yeast Wet Prep HPF POC NONE SEEN NONE SEEN   Trich, Wet  Prep NONE SEEN NONE SEEN   Clue Cells Wet Prep HPF POC NONE SEEN NONE SEEN   WBC, Wet Prep HPF POC FEW (A) NONE SEEN   Sperm NONE SEEN     A: 1. Positive pregnancy test   2. Bacterial vaginosis     P: -Discharge home in stable condition -Rx for metronidazole given to patient -First trimester precautions discussed -Patient advised to follow-up with OB of choice to start prenatal care, list given -Patient may return to MAU as needed or if her condition were to change or worsen  Rolm Bookbinder, [place]  02/10/2019 6:42 PM

## 2019-02-12 LAB — GC/CHLAMYDIA PROBE AMP (~~LOC~~) NOT AT ARMC
Chlamydia: NEGATIVE
NEISSERIA GONORRHEA: NEGATIVE

## 2019-03-16 LAB — OB RESULTS CONSOLE GC/CHLAMYDIA: Gonorrhea: NEGATIVE

## 2019-03-16 LAB — OB RESULTS CONSOLE VARICELLA ZOSTER ANTIBODY, IGG: Varicella: IMMUNE

## 2019-03-16 LAB — OB RESULTS CONSOLE RUBELLA ANTIBODY, IGM: Rubella: IMMUNE

## 2019-03-16 LAB — OB RESULTS CONSOLE HIV ANTIBODY (ROUTINE TESTING): HIV: NONREACTIVE

## 2019-03-16 LAB — OB RESULTS CONSOLE HEPATITIS B SURFACE ANTIGEN: Hepatitis B Surface Ag: NEGATIVE

## 2019-05-26 ENCOUNTER — Encounter (HOSPITAL_COMMUNITY): Payer: Self-pay | Admitting: *Deleted

## 2019-05-26 ENCOUNTER — Inpatient Hospital Stay (HOSPITAL_COMMUNITY)
Admission: AD | Admit: 2019-05-26 | Discharge: 2019-05-26 | Disposition: A | Discharge status: Home / Self Care | Payer: Medicaid Other | Attending: Obstetrics and Gynecology | Admitting: Obstetrics and Gynecology

## 2019-05-26 ENCOUNTER — Other Ambulatory Visit: Payer: Self-pay

## 2019-05-26 DIAGNOSIS — Z3A20 20 weeks gestation of pregnancy: Secondary | ICD-10-CM | POA: Diagnosis not present

## 2019-05-26 DIAGNOSIS — O368121 Decreased fetal movements, second trimester, fetus 1: Secondary | ICD-10-CM | POA: Diagnosis not present

## 2019-05-26 DIAGNOSIS — Z8759 Personal history of other complications of pregnancy, childbirth and the puerperium: Secondary | ICD-10-CM | POA: Diagnosis not present

## 2019-05-26 DIAGNOSIS — Z711 Person with feared health complaint in whom no diagnosis is made: Secondary | ICD-10-CM

## 2019-05-26 DIAGNOSIS — R1030 Lower abdominal pain, unspecified: Secondary | ICD-10-CM | POA: Diagnosis not present

## 2019-05-26 DIAGNOSIS — O368120 Decreased fetal movements, second trimester, not applicable or unspecified: Secondary | ICD-10-CM | POA: Diagnosis present

## 2019-05-26 DIAGNOSIS — Z3492 Encounter for supervision of normal pregnancy, unspecified, second trimester: Secondary | ICD-10-CM

## 2019-05-26 HISTORY — DX: Herpesviral infection, unspecified: B00.9

## 2019-05-26 LAB — URINALYSIS, ROUTINE W REFLEX MICROSCOPIC
Bilirubin Urine: NEGATIVE
Glucose, UA: NEGATIVE mg/dL
Hgb urine dipstick: NEGATIVE
Ketones, ur: 80 mg/dL — AB
Leukocytes,Ua: NEGATIVE
Nitrite: NEGATIVE
Protein, ur: 30 mg/dL — AB
Specific Gravity, Urine: 1.031 — ABNORMAL HIGH (ref 1.005–1.030)
pH: 6 (ref 5.0–8.0)

## 2019-05-26 NOTE — MAU Provider Note (Signed)
History     CSN: [id number]  Arrival date and time: 05/26/19 [id number]   First Provider Initiated Contact with Patient 05/26/19 2004      Chief Complaint  Patient presents with  . Decreased Fetal Movement    NO FM x3 DAYS    HPI   [name] is a 23 y.o. female 424P0030 @ 5264w3d here in MAU with DFM X 3 day. Says she just has not been feeling the baby move everyday and she is concerned. She has occasional lower abdominal pain that is worse when she changes positions like getting out of the car or the bed. No bleeding. History of SAB, worried today.   OB History    Gravida  4   Para      Term      Preterm      AB  3   Living        SAB  1   TAB  2   Ectopic      Multiple      Live Births              Past Medical History:  Diagnosis Date  . Herpes   . Medical history non-contributory     Past Surgical History:  Procedure Laterality Date  . TONSILLECTOMY      Family History  Problem Relation Age of Onset  . Diabetes Mother     Social History   Tobacco Use  . Smoking status: Never Smoker  . Smokeless tobacco: Never Used  Substance Use Topics  . Alcohol use: Not Currently    Comment: social  . Drug use: No    Allergies:  Allergies  Allergen Reactions  . Ceclor [Cefaclor] Hives    Medications Prior to Admission  Medication Sig Dispense Refill Last Dose  . Prenatal Vit-Fe Fumarate-FA (MULTIVITAMIN-PRENATAL) 27-0.8 MG TABS tablet Take 1 tablet by mouth daily at 12 noon.   05/26/2019 at Unknown time  . metroNIDAZOLE (FLAGYL) 500 MG tablet Take 1 tablet (500 mg total) by mouth 2 (two) times daily. 14 tablet 0    Results for orders placed or performed during the hospital encounter of 05/26/19 (from the past 48 hour(s))  Urinalysis, Routine w reflex microscopic     Status: Abnormal   Collection Time: 05/26/19  9:34 PM  Result Value Ref Range   Color, Urine YELLOW YELLOW   APPearance HAZY (A) CLEAR   Specific Gravity, Urine 1.031 (H)  1.005 - 1.030   pH 6.0 5.0 - 8.0   Glucose, UA NEGATIVE NEGATIVE mg/dL   Hgb urine dipstick NEGATIVE NEGATIVE   Bilirubin Urine NEGATIVE NEGATIVE   Ketones, ur 80 (A) NEGATIVE mg/dL   Protein, ur 30 (A) NEGATIVE mg/dL   Nitrite NEGATIVE NEGATIVE   Leukocytes,Ua NEGATIVE NEGATIVE   RBC / HPF 0-5 0 - 5 RBC/hpf   WBC, UA 0-5 0 - 5 WBC/hpf   Bacteria, UA RARE (A) NONE SEEN   Squamous Epithelial / LPF 0-5 0 - 5   Mucus PRESENT     Comment: Performed at [facility], [address] [place], [place] [address]   Review of Systems  Constitutional: Negative for fever.  Gastrointestinal: Negative for diarrhea, nausea and vomiting.  Genitourinary: Positive for pelvic pain. Negative for dysuria.   Physical Exam   Blood pressure 129/77, temperature 98.7 F (37.1 C), temperature source Oral, resp. rate 19, weight 120.7 kg, last menstrual period 01/03/2019, unknown if currently breastfeeding.  Physical Exam  Constitutional: She is oriented to person, place, and time. She appears well-developed and well-nourished. No distress.  HENT:  Head: Normocephalic.  Eyes: Pupils are equal, round, and reactive to light.  Respiratory: Effort normal.  GI: Soft. She exhibits no distension. There is no abdominal tenderness.  Genitourinary:    Genitourinary Comments: Cervix: closed, posterior    Neurological: She is alert and oriented to person, place, and time.  Skin: Skin is warm. She is not diaphoretic.  Psychiatric: Her behavior is normal.    MAU Course  Procedures  None  MDM  + fetal heart tones via doppler  UA shows signs of dehydration, patient drinking oral fluids in MAU. Says she drives a lot during the day and does not have the opportunity to drink like she should. Denies nausea.   Assessment and Plan   A:  1. Physically well but worried   2. Presence of fetal heart sounds in second trimester   3. [redacted] weeks gestation of pregnancy   4. History of fetal loss      P:  Discharge home in stable condition Return to MAU if symptoms worsen Fill a water bottle up daily and take it with you where ever you go.  F/u with OB as scheduled or sooner if needed.  Lezlie Lye, NP 05/26/2019 9:59 PM

## 2019-05-26 NOTE — MAU Note (Signed)
Pt reports to MAU c/o NO FM x3 days. No bleeding LOF. Pt reports some mild discomfort in her lower abdomen that is a 3/10 on the pain scale.

## 2019-05-26 NOTE — Discharge Instructions (Signed)

## 2019-09-14 LAB — OB RESULTS CONSOLE GBS: GBS: NEGATIVE

## 2019-09-30 ENCOUNTER — Other Ambulatory Visit: Payer: Self-pay

## 2019-09-30 ENCOUNTER — Inpatient Hospital Stay (HOSPITAL_COMMUNITY)
Admission: AD | Admit: 2019-09-30 | Discharge: 2019-10-02 | DRG: 806 | Disposition: A | Discharge status: Home / Self Care | Payer: Medicaid Other | Attending: Obstetrics and Gynecology | Admitting: Obstetrics and Gynecology

## 2019-09-30 ENCOUNTER — Encounter (HOSPITAL_COMMUNITY): Payer: Self-pay | Admitting: *Deleted

## 2019-09-30 ENCOUNTER — Inpatient Hospital Stay (HOSPITAL_COMMUNITY): Payer: Medicaid Other | Admitting: Anesthesiology

## 2019-09-30 DIAGNOSIS — O99214 Obesity complicating childbirth: Secondary | ICD-10-CM | POA: Diagnosis not present

## 2019-09-30 DIAGNOSIS — O9832 Other infections with a predominantly sexual mode of transmission complicating childbirth: Secondary | ICD-10-CM | POA: Diagnosis present

## 2019-09-30 DIAGNOSIS — Z3493 Encounter for supervision of normal pregnancy, unspecified, third trimester: Secondary | ICD-10-CM

## 2019-09-30 DIAGNOSIS — Z3689 Encounter for other specified antenatal screening: Secondary | ICD-10-CM | POA: Diagnosis not present

## 2019-09-30 DIAGNOSIS — Z3A39 39 weeks gestation of pregnancy: Secondary | ICD-10-CM

## 2019-09-30 DIAGNOSIS — Z3A38 38 weeks gestation of pregnancy: Secondary | ICD-10-CM | POA: Diagnosis not present

## 2019-09-30 DIAGNOSIS — E669 Obesity, unspecified: Secondary | ICD-10-CM | POA: Diagnosis present

## 2019-09-30 DIAGNOSIS — A6000 Herpesviral infection of urogenital system, unspecified: Secondary | ICD-10-CM | POA: Diagnosis present

## 2019-09-30 DIAGNOSIS — Z3490 Encounter for supervision of normal pregnancy, unspecified, unspecified trimester: Secondary | ICD-10-CM

## 2019-09-30 DIAGNOSIS — Z20828 Contact with and (suspected) exposure to other viral communicable diseases: Secondary | ICD-10-CM | POA: Diagnosis present

## 2019-09-30 DIAGNOSIS — O26893 Other specified pregnancy related conditions, third trimester: Secondary | ICD-10-CM | POA: Diagnosis present

## 2019-09-30 HISTORY — DX: Encounter for supervision of normal pregnancy, unspecified, third trimester: Z34.93

## 2019-09-30 LAB — CBC
HCT: 36.1 % (ref 36.0–46.0)
Hemoglobin: 11.7 g/dL — ABNORMAL LOW (ref 12.0–15.0)
MCH: 28.7 pg (ref 26.0–34.0)
MCHC: 32.4 g/dL (ref 30.0–36.0)
MCV: 88.5 fL (ref 80.0–100.0)
Platelets: 312 10*3/uL (ref 150–400)
RBC: 4.08 MIL/uL (ref 3.87–5.11)
RDW: 13 % (ref 11.5–15.5)
WBC: 9.8 10*3/uL (ref 4.0–10.5)
nRBC: 0 % (ref 0.0–0.2)

## 2019-09-30 LAB — ABO/RH: ABO/RH(D): AB POS

## 2019-09-30 LAB — RPR: RPR Ser Ql: NONREACTIVE

## 2019-09-30 LAB — TYPE AND SCREEN
ABO/RH(D): AB POS
Antibody Screen: NEGATIVE

## 2019-09-30 LAB — SARS CORONAVIRUS 2 BY RT PCR (HOSPITAL ORDER, PERFORMED IN ~~LOC~~ HOSPITAL LAB): SARS Coronavirus 2: NEGATIVE

## 2019-09-30 MED ORDER — LACTATED RINGERS IV SOLN: INTRAVENOUS | Status: DC

## 2019-09-30 MED ORDER — OXYCODONE-ACETAMINOPHEN 5-325 MG PO TABS: 1.0000 | ORAL_TABLET | ORAL | Status: DC | PRN

## 2019-09-30 MED ORDER — BUTORPHANOL TARTRATE 1 MG/ML IJ SOLN: 1.0000 mg | INTRAMUSCULAR | Status: DC | PRN

## 2019-09-30 MED ORDER — EPHEDRINE 5 MG/ML INJ: 10.0000 mg | INTRAVENOUS | Status: DC | PRN

## 2019-09-30 MED ORDER — IBUPROFEN 600 MG PO TABS
600.0000 mg | ORAL_TABLET | Freq: Four times a day (QID) | ORAL | Status: DC
  Administered 2019-09-30 – 2019-10-02 (×7): 600 mg via ORAL
  Filled 2019-09-30 (×7): qty 1

## 2019-09-30 MED ORDER — SOD CITRATE-CITRIC ACID 500-334 MG/5ML PO SOLN: 30.0000 mL | ORAL | Status: DC | PRN

## 2019-09-30 MED ORDER — SODIUM CHLORIDE (PF) 0.9 % IJ SOLN
INTRAMUSCULAR | Status: DC | PRN
  Administered 2019-09-30: 12 mL/h via EPIDURAL

## 2019-09-30 MED ORDER — COCONUT OIL OIL: 1.0000 "application " | TOPICAL_OIL | Status: DC | PRN

## 2019-09-30 MED ORDER — LACTATED RINGERS IV SOLN: 500.0000 mL | INTRAVENOUS | Status: DC | PRN

## 2019-09-30 MED ORDER — DIPHENHYDRAMINE HCL 25 MG PO CAPS: 25.0000 mg | ORAL_CAPSULE | Freq: Four times a day (QID) | ORAL | Status: DC | PRN

## 2019-09-30 MED ORDER — LACTATED RINGERS IV SOLN
500.0000 mL | Freq: Once | INTRAVENOUS | Status: AC
  Administered 2019-09-30: 500 mL via INTRAVENOUS

## 2019-09-30 MED ORDER — LACTATED RINGERS IV SOLN
INTRAVENOUS | Status: DC
  Administered 2019-09-30 (×3): via INTRAVENOUS

## 2019-09-30 MED ORDER — DIPHENHYDRAMINE HCL 50 MG/ML IJ SOLN: 12.5000 mg | INTRAMUSCULAR | Status: DC | PRN

## 2019-09-30 MED ORDER — TERBUTALINE SULFATE 1 MG/ML IJ SOLN: 0.2500 mg | Freq: Once | INTRAMUSCULAR | Status: DC | PRN

## 2019-09-30 MED ORDER — OXYTOCIN 40 UNITS IN NORMAL SALINE INFUSION - SIMPLE MED
2.5000 [IU]/h | INTRAVENOUS | Status: DC
  Administered 2019-09-30: 16:00:00 2.5 [IU]/h via INTRAVENOUS
  Filled 2019-09-30: qty 1000

## 2019-09-30 MED ORDER — PHENYLEPHRINE 40 MCG/ML (10ML) SYRINGE FOR IV PUSH (FOR BLOOD PRESSURE SUPPORT)
80.0000 ug | PREFILLED_SYRINGE | INTRAVENOUS | Status: DC | PRN
  Filled 2019-09-30: qty 10

## 2019-09-30 MED ORDER — ONDANSETRON HCL 4 MG/2ML IJ SOLN: 4.0000 mg | INTRAMUSCULAR | Status: DC | PRN

## 2019-09-30 MED ORDER — SIMETHICONE 80 MG PO CHEW: 80.0000 mg | CHEWABLE_TABLET | ORAL | Status: DC | PRN

## 2019-09-30 MED ORDER — OXYCODONE HCL 5 MG PO TABS: 10.0000 mg | ORAL_TABLET | ORAL | Status: DC | PRN

## 2019-09-30 MED ORDER — OXYTOCIN 40 UNITS IN NORMAL SALINE INFUSION - SIMPLE MED: 1.0000 m[IU]/min | INTRAVENOUS | Status: DC

## 2019-09-30 MED ORDER — ONDANSETRON HCL 4 MG PO TABS: 4.0000 mg | ORAL_TABLET | ORAL | Status: DC | PRN

## 2019-09-30 MED ORDER — DIBUCAINE (PERIANAL) 1 % EX OINT: 1.0000 "application " | TOPICAL_OINTMENT | CUTANEOUS | Status: DC | PRN

## 2019-09-30 MED ORDER — TETANUS-DIPHTH-ACELL PERTUSSIS 5-2.5-18.5 LF-MCG/0.5 IM SUSP: 0.5000 mL | Freq: Once | INTRAMUSCULAR | Status: DC

## 2019-09-30 MED ORDER — OXYCODONE-ACETAMINOPHEN 5-325 MG PO TABS: 2.0000 | ORAL_TABLET | ORAL | Status: DC | PRN

## 2019-09-30 MED ORDER — ZOLPIDEM TARTRATE 5 MG PO TABS: 5.0000 mg | ORAL_TABLET | Freq: Every evening | ORAL | Status: DC | PRN

## 2019-09-30 MED ORDER — ACETAMINOPHEN 325 MG PO TABS: 650.0000 mg | ORAL_TABLET | ORAL | Status: DC | PRN

## 2019-09-30 MED ORDER — WITCH HAZEL-GLYCERIN EX PADS: 1.0000 "application " | MEDICATED_PAD | CUTANEOUS | Status: DC | PRN

## 2019-09-30 MED ORDER — SENNOSIDES-DOCUSATE SODIUM 8.6-50 MG PO TABS
2.0000 | ORAL_TABLET | ORAL | Status: DC
  Administered 2019-09-30 – 2019-10-01 (×2): 2 via ORAL
  Filled 2019-09-30 (×2): qty 2

## 2019-09-30 MED ORDER — PRENATAL MULTIVITAMIN CH
1.0000 | ORAL_TABLET | Freq: Every day | ORAL | Status: DC
  Administered 2019-10-01: 1 via ORAL
  Filled 2019-09-30: qty 1

## 2019-09-30 MED ORDER — LIDOCAINE HCL (PF) 1 % IJ SOLN
INTRAMUSCULAR | Status: DC | PRN
  Administered 2019-09-30 (×2): 4 mL via EPIDURAL

## 2019-09-30 MED ORDER — PHENYLEPHRINE 40 MCG/ML (10ML) SYRINGE FOR IV PUSH (FOR BLOOD PRESSURE SUPPORT): 80.0000 ug | PREFILLED_SYRINGE | INTRAVENOUS | Status: DC | PRN

## 2019-09-30 MED ORDER — OXYCODONE HCL 5 MG PO TABS: 5.0000 mg | ORAL_TABLET | ORAL | Status: DC | PRN

## 2019-09-30 MED ORDER — BENZOCAINE-MENTHOL 20-0.5 % EX AERO: 1.0000 "application " | INHALATION_SPRAY | CUTANEOUS | Status: DC | PRN

## 2019-09-30 MED ORDER — FENTANYL-BUPIVACAINE-NACL 0.5-0.125-0.9 MG/250ML-% EP SOLN
12.0000 mL/h | EPIDURAL | Status: DC | PRN
  Filled 2019-09-30: qty 250

## 2019-09-30 MED ORDER — OXYTOCIN BOLUS FROM INFUSION
500.0000 mL | Freq: Once | INTRAVENOUS | Status: AC
  Administered 2019-09-30: 16:00:00 500 mL via INTRAVENOUS

## 2019-09-30 MED ORDER — LIDOCAINE HCL (PF) 1 % IJ SOLN: 30.0000 mL | INTRAMUSCULAR | Status: DC | PRN

## 2019-09-30 MED ORDER — ONDANSETRON HCL 4 MG/2ML IJ SOLN: 4.0000 mg | Freq: Four times a day (QID) | INTRAMUSCULAR | Status: DC | PRN

## 2019-09-30 NOTE — MAU Provider Note (Signed)
S: [name] is a 23 y.o. G4P0030 at [redacted]w[redacted]d  who presents to MAU today complaining of contractions since 0700 today.  She reports vomiting q 5 minutes, onset coinciding with onset of contractions. She denies vaginal bleeding. She denies LOF. She reports normal fetal movement.  She rates her contraction pain as 7-9/10. She states her cervix was 3 cm in clinic Wednesday 09/26/2019.  O: BP 120/78 (BP Location: Right Arm)   Pulse (!) 114   Temp 98.2 F (36.8 C) (Oral)   Resp 20   LMP 01/03/2019   SpO2 98%  GENERAL: Well-developed, well-nourished female in no acute distress.  HEAD: Normocephalic, atraumatic.  CHEST: Normal effort of breathing, regular heart rate ABDOMEN: Soft, nontender, gravid  Cervical exam:  Dilation: 4 Effacement (%): 60 Cervical Position: Posterior Station: -3 Presentation: Vertex Exam by:: christina robinson rn   Fetal Monitoring: Baseline: 135 Variability: Mod Accelerations: 15 x 15 Decelerations: None Contractions: Irregular, q 2-4 min   A: SIUP at [redacted]w[redacted]d  Cervical change from 3cm to 4cm in MAU per RN check GBS Neg Category 1 tracing Intact No emesis at any time during evaluation in MAU  P: Report called to Dr. Sandford Craze at [facility] to L&D  Mallie Snooks, [place] 09/30/2019 10:02 AM

## 2019-09-30 NOTE — Progress Notes (Signed)
MD at bedside to dose epidural

## 2019-09-30 NOTE — Anesthesia Postprocedure Evaluation (Signed)
Anesthesia Post Note  Patient: Colleen Zamora  Procedure(s) Performed: AN AD [place]     Patient location during evaluation: Mother Baby Anesthesia Type: Epidural Level of consciousness: awake and alert Pain management: pain level controlled Vital Signs Assessment: post-procedure vital signs reviewed and stable Respiratory status: spontaneous breathing, nonlabored ventilation and respiratory function stable Cardiovascular status: stable Postop Assessment: no headache, no backache and epidural receding Anesthetic complications: no    Last Vitals:  Vitals:   09/30/19 1746 09/30/19 1840  BP: (!) 134/52 126/77  Pulse: 80 89  Resp: 16 18  Temp: 37.6 C 37.7 C  SpO2:      Last Pain:  Vitals:   09/30/19 1840  TempSrc: Oral  PainSc: 2    Pain Goal: Patients Stated Pain Goal: 0 (09/30/19 2956)              Epidural/Spinal Function Cutaneous sensation: Tingles (09/30/19 1840), Patient able to flex knees: Yes (09/30/19 1840), Patient able to lift hips off bed: Yes (09/30/19 1840), Back pain beyond tenderness at insertion site: No (09/30/19 1840), Progressively worsening motor and/or sensory loss: No (09/30/19 1840), Bowel and/or bladder incontinence post epidural: No (09/30/19 1840)  Dain Laseter

## 2019-09-30 NOTE — Anesthesia Procedure Notes (Signed)
Epidural Patient location during procedure: OB  Staffing Anesthesiologist: Branon Sabine, MD Performed: anesthesiologist   Preanesthetic Checklist Completed: patient identified, pre-op evaluation, timeout performed, IV checked, risks and benefits discussed and monitors and equipment checked  Epidural Patient position: sitting Prep: site prepped and draped and DuraPrep Patient monitoring: heart rate, continuous pulse ox and blood pressure Approach: midline Location: L3-L4 Injection technique: LOR air and LOR saline  Needle:  Needle type: Tuohy  Needle gauge: 17 G Needle length: 9 cm Needle insertion depth: 6 cm Catheter type: closed end flexible Catheter size: 19 Gauge Catheter at skin depth: 11 cm Test dose: negative  Assessment Sensory level: T8 Events: blood not aspirated, injection not painful, no injection resistance, negative IV test and no paresthesia  Additional Notes Reason for block:procedure for pain     

## 2019-09-30 NOTE — H&P (Signed)
Colleen Zamora is a 23 y.o. female g4p0030at 39+ in labor, cervical change in MAU.Relatively uncomplicated PNV - Pt with herpes, on suppression.  Also w ?CHTN vs PIH in earlier pregnancy.  Maternal obesity.  Pregnancy dated by LMP cw [place].  Received Tdap 8/21  OB History    Gravida  4   Para      Term      Preterm      AB  3   Living        SAB  1   TAB  2   Ectopic      Multiple      Live Births            TAB x 2, SAB x1 G4 present  No abn pap H/o trich  Past Medical History:  Diagnosis Date  . Herpes   . Medical history non-contributory   . Normal pregnancy in third trimester 09/30/2019   Past Surgical History:  Procedure Laterality Date  . TONSILLECTOMY    D&C x 2  Family History: family history includes Diabetes in her mother. Social History:  reports that she has never smoked. She has never used smokeless tobacco. She reports previous alcohol use. She reports that she does not use drugs.(h/o MJ use)  Meds baby ASA, PNV All Ceclor     Maternal Diabetes: No Genetic Screening: Normal Maternal Ultrasounds/Referrals: Normal Fetal Ultrasounds or other Referrals:  None Maternal Substance Abuse:  No Significant Maternal Medications:  None Significant Maternal Lab Results:  Group B Strep negative Other Comments:  None  Review of Systems  Constitutional: Negative.   HENT: Negative.   Eyes: Negative.   Respiratory: Negative.   Cardiovascular: Negative.   Gastrointestinal: Positive for abdominal pain.  Genitourinary: Negative.   Musculoskeletal: Positive for back pain.  Skin: Negative.   Neurological: Negative.   Psychiatric/Behavioral: Negative.    Maternal Medical History:  Reason for admission: Contractions.   Contractions: Onset was 3-5 hours ago.   Frequency: regular.   Perceived severity is moderate.    Fetal activity: Perceived fetal activity is normal.    Prenatal Complications - Diabetes: none.    Dilation: 4 Effacement (%):  60 Station: -3 Exam by:: christina robinson rn Blood pressure 120/78, pulse (!) 114, temperature 98.2 F (36.8 C), temperature source Oral, resp. rate 20, height 5\' 8"  (1.727 m), weight 113.4 kg, last menstrual period 01/03/2019, SpO2 98 %, unknown if currently breastfeeding. Maternal Exam:  Uterine Assessment: Contraction strength is moderate.  Contraction frequency is regular.   Abdomen: Patient reports generalized tenderness.  Fundal height is appropriate for gestation.   Estimated fetal weight is 7-8#.   Fetal presentation: vertex  Introitus: Normal vulva. Normal vagina.    Physical Exam  Constitutional: She is oriented to person, place, and time. She appears well-developed and well-nourished.  HENT:  Head: Normocephalic and atraumatic.  Cardiovascular: Normal rate and regular rhythm.  Respiratory: Effort normal and breath sounds normal. No respiratory distress. She has no wheezes.  GI: Soft. Bowel sounds are normal. She exhibits no distension. There is generalized abdominal tenderness.  Genitourinary:    Vulva normal.   Musculoskeletal: Normal range of motion.  Neurological: She is alert and oriented to person, place, and time.  Skin: Skin is warm and dry.  Psychiatric: She has a normal mood and affect. Her behavior is normal.    Prenatal labs: ABO, Rh:  AB+ Antibody:  neg Rubella:  immune RPR:   NR HBsAg:   neg HIV:  neg GBS:   neg  Tdap 8/21  [place] nl anat, post plac, female  Hgb 13.2/Plt 354/Ur Cx neg/Chl neg/GC neg/Varicella immune/Hgb electro WNL/nl TSH/nl NT, nl first tri screen/glucola 114/  Assessment/Plan: 23yo G4P0030 at 39+ with cervical change in MAU Epidural prn Augment prn Expect SVD    Kye Silverstein Bovard-Stuckert 09/30/2019, 10:53 AM

## 2019-09-30 NOTE — MAU Note (Signed)
Colleen Zamora is a 23 y.o. at [redacted]w[redacted]d here in MAU reporting:  +contractions Denies LOF Denies Vaginal bleeding States lost her mucus plug Onset of complaint: 7am Pain score: 9/10. Requesting something for pain. Is planning an epidural Endorses being 2-3 cm in the office on Wednesday Vitals:   09/30/19 0835  BP: 120/78  Pulse: (!) 114  Resp: 20  Temp: 98.2 F (36.8 C)  SpO2: 98%     FHT: 140 Lab orders placed from triage: mau labor triage

## 2019-09-30 NOTE — Anesthesia Preprocedure Evaluation (Signed)
Anesthesia Evaluation  Patient identified by MRN, date of birth, ID band Patient awake    Reviewed: Allergy & Precautions, NPO status , Patient's Chart, lab work & pertinent test results  Airway Mallampati: II  TM Distance: >3 FB Neck ROM: Full    Dental  (+) Dental Advisory Given   Pulmonary neg pulmonary ROS,    Pulmonary exam normal breath sounds clear to auscultation       Cardiovascular negative cardio ROS Normal cardiovascular exam Rhythm:Regular Rate:Normal     Neuro/Psych negative neurological ROS  negative psych ROS   GI/Hepatic negative GI ROS, Neg liver ROS,   Endo/Other  negative endocrine ROS  Renal/GU negative Renal ROS     Musculoskeletal negative musculoskeletal ROS (+)   Abdominal (+) + obese,   Peds  Hematology negative hematology ROS (+)   Anesthesia Other Findings   Reproductive/Obstetrics (+) Pregnancy                             Anesthesia Physical Anesthesia Plan  ASA: II  Anesthesia Plan: Epidural   Post-op Pain Management:    Induction:   PONV Risk Score and Plan:   Airway Management Planned:   Additional Equipment:   Intra-op Plan:   Post-operative Plan:   Informed Consent: I have reviewed the patients History and Physical, chart, labs and discussed the procedure including the risks, benefits and alternatives for the proposed anesthesia with the patient or authorized representative who has indicated his/her understanding and acceptance.       Plan Discussed with:   Anesthesia Plan Comments:         Anesthesia Quick Evaluation

## 2019-10-01 LAB — CBC
HCT: 32.5 % — ABNORMAL LOW (ref 36.0–46.0)
Hemoglobin: 10.6 g/dL — ABNORMAL LOW (ref 12.0–15.0)
MCH: 28.5 pg (ref 26.0–34.0)
MCHC: 32.6 g/dL (ref 30.0–36.0)
MCV: 87.4 fL (ref 80.0–100.0)
Platelets: 280 10*3/uL (ref 150–400)
RBC: 3.72 MIL/uL — ABNORMAL LOW (ref 3.87–5.11)
RDW: 12.9 % (ref 11.5–15.5)
WBC: 16.3 10*3/uL — ABNORMAL HIGH (ref 4.0–10.5)
nRBC: 0 % (ref 0.0–0.2)

## 2019-10-01 NOTE — Lactation Note (Signed)
This note was copied from a baby's chart. Lactation Consultation Note  Patient Name: Colleen Zamora WOEHO'Z Date: 10/01/2019 Reason for consult: Initial assessment;1st time breastfeeding;Early term 37-38.6wks   Mom with first baby, feeding when [place] entered.  LC offered to provide a few pillows for support.  Mom was leaned over appearing uncomfortable in bed.  Baby was latched with mouth visible.  Mom was taught how to break latch and then baby was burped.  Nipple appeared more rounded on one side when infant release.  Mom said infant fed 22 minutes.  Pillows provided for mom to feed in football.  Burp cloth rolled and placed under breast to help support.  Breast are not large but very soft and fleshy.  Mom did have breast changes during pregnancy.    Infant latched well relatively easily, a couple of swallows heard.  Mom used compression during feeds.  Infant appears relaxed at breast but is actively sucking with regular pauses.   BF basics reviewed with family.  At least 8-12 feeds in 24 hours, feeding cues, STS, cluster feeds, and keeping infant active when at the breast, (waking techniques).  BFSG information and lactation phone line info relayed to patient.  Also informed mom of OP [place] service.  Maternal Data Has patient been taught Hand Expression?: Yes Does the patient have breastfeeding experience prior to this delivery?: No  Feeding Feeding Type: Breast Fed  LATCH Score Latch: Repeated attempts needed to sustain latch, nipple held in mouth throughout feeding, stimulation needed to elicit sucking reflex.  Audible Swallowing: A few with stimulation  Type of Nipple: Everted at rest and after stimulation  Comfort (Breast/Nipple): Soft / non-tender  Hold (Positioning): Assistance needed to correctly position infant at breast and maintain latch.  LATCH Score: 7  Interventions Interventions: Breast feeding basics reviewed;Assisted with latch;Skin to skin;Breast  massage;Position options;Support pillows;Adjust position;Breast compression  Lactation Tools Discussed/Used     Consult Status Consult Status: Follow-up Date: 10/02/19 Follow-up type: In-patient    Ferne Coe [facility] 10/01/2019, 10:03 AM

## 2019-10-01 NOTE — Progress Notes (Signed)
Post Partum Day 1 Subjective: no complaints, up ad lib and tolerating PO  Objective: Blood pressure 117/74, pulse 74, temperature 98.2 F (36.8 C), temperature source Oral, resp. rate 20, height 5\' 8"  (1.727 m), weight 113.4 kg, last menstrual period 01/03/2019, SpO2 99 %, unknown if currently breastfeeding.  Physical Exam:  General: alert and cooperative Lochia: appropriate Uterine Fundus: firm  Recent Labs    09/30/19 1010 10/01/19 0446  HGB 11.7* 10.6*  HCT 36.1 32.5*    Assessment/Plan: Plan for discharge tomorrow  Pt states wants circumcision in hospital but needs to pay here still, will try to do so by this PM    LOS: 1 day   Logan Bores 10/01/2019, 12:31 PM

## 2019-10-02 MED ORDER — ACETAMINOPHEN 325 MG PO TABS: 650.0000 mg | ORAL_TABLET | ORAL | 0 refills | Status: AC | PRN

## 2019-10-02 MED ORDER — IBUPROFEN 600 MG PO TABS: 600.0000 mg | ORAL_TABLET | Freq: Four times a day (QID) | ORAL | 0 refills | Status: AC

## 2019-10-02 NOTE — Progress Notes (Signed)
Post Partum Day 2 Subjective: no complaints, up ad lib and tolerating PO   Tired from baby cluster feeding Objective: Blood pressure 129/80, pulse 73, temperature 98.5 F (36.9 C), temperature source Oral, resp. rate 16, height 5\' 8"  (1.727 m), weight 113.4 kg, last menstrual period 01/03/2019, SpO2 99 %, unknown if currently breastfeeding.  Physical Exam:  General: alert and cooperative Lochia: appropriate Uterine Fundus: firm   Recent Labs    09/30/19 1010 10/01/19 0446  HGB 11.7* 10.6*  HCT 36.1 32.5*    Assessment/Plan: Discharge home   LOS: 2 days   Logan Bores 10/02/2019, 7:28 AM

## 2019-10-02 NOTE — Lactation Note (Signed)
This note was copied from a baby's chart. Lactation Consultation Note Attempted to see mom. Baby 16 hrs old. Has started voiding since supplementing w/formula. Mom sleeping. RN stated she really tired, hasn't slept any d/t baby cluster feeding. Baby is being supplemented per MD order. Mom  Pumped first half of shift hasn't during last half d/t baby cluster feeding and mom exhausted.  Patient Name: Colleen Zamora [id number] Date: 10/02/2019     Maternal Data    Feeding Feeding Type: Bottle Fed - Formula  LATCH Score                   Interventions    Lactation Tools Discussed/Used     Consult Status      Mikah Poss G 10/02/2019, 4:41 AM

## 2019-10-02 NOTE — Discharge Summary (Signed)
OB Discharge Summary     Patient Name: Colleen Zamora DOB: 1996/01/06 MRN: [id number]  Date of admission: 09/30/2019 Delivering MD: Sherian Rein   Date of discharge: 10/02/2019  Admitting diagnosis: 38 weeks cxt every 5 min  Intrauterine pregnancy: [redacted]w[redacted]d     Secondary diagnosis:  Principal Problem:   SVD (spontaneous vaginal delivery) Active Problems:   Normal pregnancy in third trimester   Normal pregnancy  Additional problems: none     Discharge diagnosis: Term Pregnancy Delivered                                                                                                Post partum procedures:none  Augmentation: AROM and Pitocin  Complications: None  Hospital course:  Onset of Labor With Vaginal Delivery     23 y.o. yo [id number] at [redacted]w[redacted]d was admitted in Active Labor on 09/30/2019. Patient had an uncomplicated labor course as follows:  Membrane Rupture Time/Date: 11:42 AM ,09/30/2019   Intrapartum Procedures: Episiotomy: None [1]                                         Lacerations:  2nd degree [3];Perineal [11]  Patient had a delivery of a Viable infant. 09/30/2019  Information for the patient's newborn:  Cheryle, Dark [[id number]]  Delivery Method: Vag-Spont     Pateint had an uncomplicated postpartum course.  She is ambulating, tolerating a regular diet, passing flatus, and urinating well. Patient is discharged home in stable condition on 10/02/19.   Physical exam  Vitals:   10/01/19 0320 10/01/19 0904 10/01/19 2135 10/02/19 0427  BP: 119/78 117/74 115/69 129/80  Pulse: 79 74 88 73  Resp: 16 20 18 16   Temp: 98.4 F (36.9 C) 98.2 F (36.8 C) 98.7 F (37.1 C) 98.5 F (36.9 C)  TempSrc: Oral Oral Oral Oral  SpO2: 99% 99%    Weight:      Height:       General: alert and cooperative Lochia: appropriate Uterine Fundus: firm  Labs: Lab Results  Component Value Date   WBC 16.3 (H) 10/01/2019   HGB 10.6 (L) 10/01/2019   HCT 32.5 (L)  10/01/2019   MCV 87.4 10/01/2019   PLT 280 10/01/2019   CMP Latest Ref Rng & Units 04/12/2017  Glucose 65 - 99 mg/dL 87  BUN 6 - 20 mg/dL 14  Creatinine 06/12/2017 - 5.27 mg/dL 7.82  Sodium 4.23 - 536 mmol/L 139  Potassium 3.5 - 5.1 mmol/L 3.8  Chloride 101 - 111 mmol/L 106  CO2 22 - 32 mmol/L 27  Calcium 8.9 - 10.3 mg/dL 9.1    Discharge instruction: per After Visit Summary and "Baby and Me Booklet".  After visit meds:  Allergies as of 10/02/2019      Reactions   Ceclor [cefaclor] Hives      Medication List    STOP taking these medications   metroNIDAZOLE 500 MG tablet Commonly known as: FLAGYL     TAKE these medications  acetaminophen 325 MG tablet Commonly known as: Tylenol Take 2 tablets (650 mg total) by mouth every 4 (four) hours as needed (for pain scale < 4).   ibuprofen 600 MG tablet Commonly known as: ADVIL Take 1 tablet (600 mg total) by mouth every 6 (six) hours.   multivitamin-prenatal 27-0.8 MG Tabs tablet Take 1 tablet by mouth daily at 12 noon.       Diet: routine diet  Activity: Advance as tolerated. Pelvic rest for 6 weeks.   Outpatient follow up:6 weeks Follow up Appt:No future appointments. Follow up Visit:No follow-ups on file.  Postpartum contraception: Progesterone only pills  Newborn Data: Live born female  Birth Weight: 8 lb 3.6 oz (3730 g) APGAR: 9, 8  Newborn Delivery   Birth date/time: 09/30/2019 15:45:00 Delivery type: Vaginal, Spontaneous      Baby Feeding: Breast Disposition:home with mother   10/02/2019 Logan Bores, MD

## 2019-12-31 ENCOUNTER — Encounter (HOSPITAL_COMMUNITY): Payer: Self-pay | Admitting: Obstetrics and Gynecology

## 2024-09-24 ENCOUNTER — Other Ambulatory Visit (HOSPITAL_COMMUNITY): Payer: Self-pay

## 2024-09-24 MED ORDER — FLUZONE 0.5 ML IM SUSY
0.5000 mL | PREFILLED_SYRINGE | Freq: Once | INTRAMUSCULAR | 0 refills | Status: AC
  Filled 2024-09-24: qty 0.5, 1d supply, fill #0
